# Patient Record
Sex: Female | Born: 1945 | Hispanic: No | Smoking: Never smoker
Health system: Southern US, Community
[De-identification: ages and names within clinical notes are randomized; demographics above are authoritative.]

## PROBLEM LIST (undated history)

## (undated) DIAGNOSIS — H409 Unspecified glaucoma: Secondary | ICD-10-CM

## (undated) DIAGNOSIS — M810 Age-related osteoporosis without current pathological fracture: Secondary | ICD-10-CM

## (undated) DIAGNOSIS — K219 Gastro-esophageal reflux disease without esophagitis: Secondary | ICD-10-CM

## (undated) DIAGNOSIS — E559 Vitamin D deficiency, unspecified: Secondary | ICD-10-CM

## (undated) DIAGNOSIS — I10 Essential (primary) hypertension: Secondary | ICD-10-CM

## (undated) HISTORY — DX: Essential (primary) hypertension: I10

## (undated) HISTORY — DX: Gastro-esophageal reflux disease without esophagitis: K21.9

## (undated) HISTORY — DX: Vitamin D deficiency, unspecified: E55.9

## (undated) HISTORY — DX: Unspecified glaucoma: H40.9

## (undated) HISTORY — PX: CATARACT EXTRACTION W/ INTRAOCULAR LENS IMPLANT: SHX1309

## (undated) HISTORY — DX: Age-related osteoporosis without current pathological fracture: M81.0

## (undated) HISTORY — PX: ABDOMINAL HYSTERECTOMY: SHX81

---

## 2010-06-07 ENCOUNTER — Ambulatory Visit: Payer: Self-pay | Admitting: Internal Medicine

## 2010-06-15 ENCOUNTER — Ambulatory Visit: Payer: Self-pay | Admitting: Internal Medicine

## 2010-10-04 ENCOUNTER — Ambulatory Visit: Payer: Self-pay | Admitting: Family

## 2010-10-06 ENCOUNTER — Ambulatory Visit: Payer: Self-pay | Admitting: Internal Medicine

## 2010-10-15 ENCOUNTER — Ambulatory Visit: Payer: Self-pay | Admitting: Internal Medicine

## 2010-10-29 ENCOUNTER — Emergency Department: Payer: Self-pay | Admitting: Emergency Medicine

## 2011-02-02 ENCOUNTER — Ambulatory Visit: Payer: Self-pay | Admitting: Internal Medicine

## 2011-02-14 ENCOUNTER — Ambulatory Visit: Payer: Self-pay | Admitting: Internal Medicine

## 2011-06-20 ENCOUNTER — Ambulatory Visit: Payer: Self-pay | Admitting: Ophthalmology

## 2011-08-03 ENCOUNTER — Ambulatory Visit: Payer: Self-pay | Admitting: Internal Medicine

## 2011-08-03 LAB — CBC CANCER CENTER
Basophil: 2 %
HCT: 32.6 % — ABNORMAL LOW (ref 35.0–47.0)
MCH: 22 pg — ABNORMAL LOW (ref 26.0–34.0)
MCV: 67 fL — ABNORMAL LOW (ref 80–100)
Monocytes: 4 %
Platelet: 222 x10 3/mm (ref 150–440)
RBC: 4.85 10*6/uL (ref 3.80–5.20)
RDW: 16.1 % — ABNORMAL HIGH (ref 11.5–14.5)

## 2011-08-03 LAB — RETICULOCYTES
Absolute Retic Count: 0.131 10*6/uL — ABNORMAL HIGH (ref 0.024–0.084)
Reticulocyte: 2.7 % — ABNORMAL HIGH (ref 0.5–1.5)

## 2011-08-17 ENCOUNTER — Ambulatory Visit: Payer: Self-pay | Admitting: Internal Medicine

## 2011-10-31 ENCOUNTER — Ambulatory Visit: Payer: Self-pay | Admitting: Ophthalmology

## 2014-02-18 ENCOUNTER — Ambulatory Visit: Payer: Self-pay | Admitting: Family Medicine

## 2014-09-30 ENCOUNTER — Ambulatory Visit: Payer: Self-pay | Admitting: Cardiovascular Disease

## 2014-10-07 ENCOUNTER — Encounter: Payer: Self-pay | Admitting: Cardiovascular Disease

## 2014-10-07 ENCOUNTER — Ambulatory Visit (INDEPENDENT_AMBULATORY_CARE_PROVIDER_SITE_OTHER): Payer: Medicaid Other | Admitting: Cardiovascular Disease

## 2014-10-07 ENCOUNTER — Encounter (INDEPENDENT_AMBULATORY_CARE_PROVIDER_SITE_OTHER): Payer: Self-pay

## 2014-10-07 VITALS — BP 130/82 | HR 60 | Ht 62.0 in | Wt 124.8 lb

## 2014-10-07 DIAGNOSIS — I159 Secondary hypertension, unspecified: Secondary | ICD-10-CM | POA: Diagnosis not present

## 2014-10-07 DIAGNOSIS — E785 Hyperlipidemia, unspecified: Secondary | ICD-10-CM

## 2014-10-07 DIAGNOSIS — R079 Chest pain, unspecified: Secondary | ICD-10-CM

## 2014-10-07 DIAGNOSIS — Z01810 Encounter for preprocedural cardiovascular examination: Secondary | ICD-10-CM

## 2014-10-07 DIAGNOSIS — I10 Essential (primary) hypertension: Secondary | ICD-10-CM

## 2014-10-07 DIAGNOSIS — H5462 Unqualified visual loss, left eye, normal vision right eye: Secondary | ICD-10-CM

## 2014-10-07 NOTE — Assessment & Plan Note (Signed)
Patient's son reports that the patient has vision loss on the left. Surgery planned for the right eye. He indicates history of glaucoma

## 2014-10-07 NOTE — Progress Notes (Signed)
Patient ID: Rachel [name], female    DOB: 12/07/1945, 69 y.o.   MRN: [id number]  HPI Comments: Rachel [name] is a very pleasant 69 year old woman from [place], who does not speaking issue presents with her son for preoperative evaluation for eye surgery. She is due to have eye surgery at [facility].  Son reports she has no prior cardiac history. Blood pressure has been relatively well controlled. She was started on low-dose HCTZ 12.5 mg daily in addition to atenolol 25 mill grams daily in January 2016 but reports having side effects to the HCTZ. She has been continued on the atenolol with good blood pressure. Overall she feels well. 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  Son reports she is active, does all of her ADLs, goes shopping, does gardening, of her housework. Denies any shortness of breath, chest pain, leg edema, orthopnea or PND  Recent blood work shows total cholesterol 212, LDL 115, HDL 77, normal renal function, potassium 4.3, low vitamin D of 17   EKG on today's visit shows normal sinus rhythm with no significant ST or T-wave changes  Notes indicate allergy to ace inhibitors   No Known Allergies  Outpatient Encounter Prescriptions as of 10/07/2014  Medication Sig  . alendronate (FOSAMAX) 70 MG tablet Take 70 mg by mouth once a week. Take with a full glass of water on an empty stomach.  Marland Kitchen. atenolol (TENORMIN) 25 MG tablet Take 25 mg by mouth daily. for high blood pressure  . brimonidine (ALPHAGAN) 0.2 % ophthalmic solution Place 1 drop into both eyes 2 (two) times daily.   . Calcium Carbonate (CALCIUM 600 PO) Take 600 mg by mouth daily.  . cholecalciferol (VITAMIN D) 1000 UNITS tablet Take 1,000 Units by mouth daily.  . dorzolamide-timolol (COSOPT) 22.3-6.8 MG/ML ophthalmic solution Place 1 drop into both eyes 2 (two) times daily.   Marland Kitchen. latanoprost (XALATAN) 0.005 % ophthalmic solution Place 1 drop  into both eyes at bedtime.   . pantoprazole (PROTONIX) 40 MG tablet Take 40 mg by mouth daily.   . [DISCONTINUED] hydrochlorothiazide (MICROZIDE) 12.5 MG capsule Take 12.5 mg by mouth daily.    Past Medical History  Diagnosis Date  . Hypertension   . Vitamin D deficiency   . GERD (gastroesophageal reflux disease)   . Osteoporosis   . Glaucoma     History reviewed. No pertinent past surgical history.  Social History  reports that she has never smoked. She does not have any smokeless tobacco history on file. She reports that she drinks about 0.6 oz of alcohol per week. She reports that she does not use illicit drugs.  Family History Family history is unknown by patient.      Review of Systems  Constitutional: Negative.   Eyes: Positive for visual disturbance.  Respiratory: Negative.   Cardiovascular: Negative.   Gastrointestinal: Negative.   Musculoskeletal: Negative.   Skin: Negative.   Neurological: Negative.   Hematological: Negative.   Psychiatric/Behavioral: Negative.   All other systems reviewed and are negative.   BP 130/82 mmHg  Pulse 60  Ht 5\' 2"  (1.575 m)  Wt 124 lb 12 oz (56.586 kg)  BMI 22.81 kg/m2  Physical Exam  Constitutional: She is oriented to person, place, and time. She appears well-developed and well-nourished.  HENT:  Head: Normocephalic.  Nose: Nose normal.  Mouth/Throat: Oropharynx is clear and moist.  Eyes: Conjunctivae are normal. Pupils  are equal, round, and reactive to light.  Neck: Normal range of motion. Neck supple. No JVD present.  Cardiovascular: Normal rate, regular rhythm, S1 normal, S2 normal, normal heart sounds and intact distal pulses.  Exam reveals no gallop and no friction rub.   No murmur heard. Pulmonary/Chest: Effort normal and breath sounds normal. No respiratory distress. She has no wheezes. She has no rales. She exhibits no tenderness.  Abdominal: Soft. Bowel sounds are normal. She exhibits no distension. There is no  tenderness.  Musculoskeletal: Normal range of motion. She exhibits no edema or tenderness.  Lymphadenopathy:    She has no cervical adenopathy.  Neurological: She is alert and oriented to person, place, and time. Coordination normal.  Skin: Skin is warm and dry. No rash noted. No erythema.  Psychiatric: She has a normal mood and affect. Her behavior is normal. Judgment and thought content normal.    Assessment and Plan  Nursing note and vitals reviewed.

## 2014-10-07 NOTE — Patient Instructions (Signed)
You are doing well. No medication changes were made.  You are acceptable risk for eye surgery  Please call us if you have new issues that need to be addressed before your next appt.

## 2014-10-07 NOTE — Assessment & Plan Note (Signed)
Should be acceptable risk for upcoming eye surgery. No further testing needed. No prior cardiac history. Blood pressure well controlled, normal EKG No symptoms concerning for angina

## 2014-10-07 NOTE — Assessment & Plan Note (Signed)
Total cholesterol 212. Numbers were discussed with the patient and her son. Recommended regular exercise, strict diet. No significant risk factors warranting a statin

## 2014-10-07 NOTE — Assessment & Plan Note (Signed)
Blood pressure is well controlled on today's visit. No changes made to the medications. Patient reports she did not tolerate HCTZ eared she will stay on atenolol alone

## 2014-11-01 ENCOUNTER — Ambulatory Visit
Admit: 2014-11-01 | Disposition: A | Discharge status: Home / Self Care | Payer: Self-pay | Attending: Ophthalmology | Admitting: Ophthalmology

## 2015-08-15 ENCOUNTER — Encounter: Payer: Self-pay | Admitting: Emergency Medicine

## 2015-08-15 ENCOUNTER — Emergency Department
Admission: EM | Admit: 2015-08-15 | Discharge: 2015-08-15 | Disposition: A | Discharge status: Home / Self Care | Payer: Medicaid Other | Attending: Emergency Medicine | Admitting: Emergency Medicine

## 2015-08-15 ENCOUNTER — Emergency Department: Payer: Medicaid Other

## 2015-08-15 DIAGNOSIS — M545 Low back pain: Secondary | ICD-10-CM | POA: Insufficient documentation

## 2015-08-15 DIAGNOSIS — I10 Essential (primary) hypertension: Secondary | ICD-10-CM | POA: Diagnosis not present

## 2015-08-15 DIAGNOSIS — Z79899 Other long term (current) drug therapy: Secondary | ICD-10-CM | POA: Diagnosis not present

## 2015-08-15 DIAGNOSIS — R1031 Right lower quadrant pain: Secondary | ICD-10-CM | POA: Diagnosis not present

## 2015-08-15 DIAGNOSIS — R109 Unspecified abdominal pain: Secondary | ICD-10-CM | POA: Diagnosis present

## 2015-08-15 LAB — COMPREHENSIVE METABOLIC PANEL
ALK PHOS: 55 U/L (ref 38–126)
ALT: 11 U/L — ABNORMAL LOW (ref 14–54)
AST: 17 U/L (ref 15–41)
Albumin: 4.5 g/dL (ref 3.5–5.0)
Anion gap: 5 (ref 5–15)
BUN: 21 mg/dL — AB (ref 6–20)
CHLORIDE: 114 mmol/L — AB (ref 101–111)
CO2: 19 mmol/L — ABNORMAL LOW (ref 22–32)
Calcium: 8.9 mg/dL (ref 8.9–10.3)
Creatinine, Ser: 0.85 mg/dL (ref 0.44–1.00)
GFR calc Af Amer: >60 mL/min (ref >60)
Glucose, Bld: 99 mg/dL (ref 65–99)
Potassium: 3.1 mmol/L — ABNORMAL LOW (ref 3.5–5.1)
Sodium: 138 mmol/L (ref 135–145)
TOTAL PROTEIN: 8 g/dL (ref 6.5–8.1)
Total Bilirubin: 0.3 mg/dL (ref 0.3–1.2)

## 2015-08-15 LAB — URINALYSIS COMPLETE WITH MICROSCOPIC (ARMC ONLY)
BACTERIA UA: NONE SEEN
BILIRUBIN URINE: NEGATIVE
GLUCOSE, UA: NEGATIVE mg/dL
Hgb urine dipstick: NEGATIVE
KETONES UR: NEGATIVE mg/dL
LEUKOCYTES UA: NEGATIVE
Nitrite: NEGATIVE
PH: 6 (ref 5.0–8.0)
Protein, ur: NEGATIVE mg/dL
Specific Gravity, Urine: 1.011 (ref 1.005–1.030)

## 2015-08-15 LAB — CBC
HEMATOCRIT: 34 % — AB (ref 35.0–47.0)
Hemoglobin: 11 g/dL — ABNORMAL LOW (ref 12.0–16.0)
MCH: 21 pg — AB (ref 26.0–34.0)
MCHC: 32.4 g/dL (ref 32.0–36.0)
MCV: 64.7 fL — AB (ref 80.0–100.0)
PLATELETS: 174 10*3/uL (ref 150–440)
RBC: 5.25 MIL/uL — AB (ref 3.80–5.20)
RDW: 15.7 % — ABNORMAL HIGH (ref 11.5–14.5)
WBC: 9.4 10*3/uL (ref 3.6–11.0)

## 2015-08-15 LAB — LIPASE, BLOOD: Lipase: 40 U/L (ref 11–51)

## 2015-08-15 MED ORDER — IOHEXOL 300 MG/ML  SOLN
75.0000 mL | Freq: Once | INTRAMUSCULAR | Status: AC | PRN
  Administered 2015-08-15: 75 mL via INTRAVENOUS
  Filled 2015-08-15: qty 75

## 2015-08-15 MED ORDER — IOHEXOL 240 MG/ML SOLN
25.0000 mL | Freq: Once | INTRAMUSCULAR | Status: AC | PRN
  Administered 2015-08-15: 25 mL via ORAL
  Filled 2015-08-15: qty 25

## 2015-08-15 MED ORDER — ONDANSETRON HCL 4 MG/2ML IJ SOLN
4.0000 mg | Freq: Once | INTRAMUSCULAR | Status: AC
  Administered 2015-08-15: 4 mg via INTRAVENOUS
  Filled 2015-08-15: qty 2

## 2015-08-15 MED ORDER — HYDROCODONE-ACETAMINOPHEN 5-325 MG PO TABS: 1.0000 | ORAL_TABLET | ORAL | Status: DC | PRN

## 2015-08-15 MED ORDER — MORPHINE SULFATE (PF) 4 MG/ML IV SOLN
4.0000 mg | Freq: Once | INTRAVENOUS | Status: AC
Start: 2015-08-15 — End: 2015-08-15
  Administered 2015-08-15: 4 mg via INTRAVENOUS
  Filled 2015-08-15: qty 1

## 2015-08-15 NOTE — ED Provider Notes (Signed)
[facility] Emergency Department Provider Note  ____________________________________________    I have reviewed the triage vital signs and the nursing notes.   HISTORY  Chief Complaint Abdominal Pain  [place] interpreter used  HPI Rachel [name] is a 70 y.o. female who presents with complaints of right back/flank pain. This became worse this morning. She describes the pain as moderate to severe and worse with ambulating. She is never had this before. She denies fevers. She denies dysuria. No nausea or vomiting. No lower extremity weakness or sensory changes. No urinary incontinence.     Past Medical History  Diagnosis Date  . Hypertension   . Vitamin D deficiency   . GERD (gastroesophageal reflux disease)   . Osteoporosis   . Glaucoma     Patient Active Problem List   Diagnosis Date Noted  . Preop cardiovascular exam 10/07/2014  . Essential hypertension 10/07/2014  . Hyperlipidemia 10/07/2014  . Vision loss of left eye 10/07/2014    History reviewed. No pertinent past surgical history.  Current Outpatient Rx  Name  Route  Sig  Dispense  Refill  . alendronate (FOSAMAX) 70 MG tablet   Oral   Take 70 mg by mouth once a week. Take with a full glass of water on an empty stomach.         Marland Kitchen atenolol (TENORMIN) 25 MG tablet   Oral   Take 25 mg by mouth daily. for high blood pressure      0   . brimonidine (ALPHAGAN) 0.2 % ophthalmic solution   Both Eyes   Place 1 drop into both eyes 2 (two) times daily.       0   . Calcium Carbonate (CALCIUM 600 PO)   Oral   Take 600 mg by mouth daily.         . cholecalciferol (VITAMIN D) 1000 UNITS tablet   Oral   Take 1,000 Units by mouth daily.         . dorzolamide-timolol (COSOPT) 22.3-6.8 MG/ML ophthalmic solution   Both Eyes   Place 1 drop into both eyes 2 (two) times daily.       0   . latanoprost (XALATAN) 0.005 % ophthalmic solution   Both Eyes   Place 1 drop into both eyes at  bedtime.       0   . pantoprazole (PROTONIX) 40 MG tablet   Oral   Take 40 mg by mouth daily.       0     Allergies Review of patient's allergies indicates no known allergies.  Family History  Problem Relation Age of Onset  . Family history unknown: Yes    Social History Social History  Substance Use Topics  . Smoking status: Never Smoker   . Smokeless tobacco: None  . Alcohol Use: 0.6 oz/week    1 Glasses of wine per week    Review of Systems  Constitutional: Negative for fever. Eyes: Negative for change in vision ENT: Negative for sore throat Cardiovascular: Negative for chest pain. Respiratory: Negative for shortness of breath. Gastrointestinal: Right flank pain Genitourinary: Negative for dysuria. Negative frequency Musculoskeletal: Right-sided back pain Skin: Negative for rash. Neurological: Negative for headaches or focal weakness     ____________________________________________   PHYSICAL EXAM:  VITAL SIGNS: ED Triage Vitals  Enc Vitals Group     BP 08/15/15 1801 151/86 mmHg     Pulse Rate 08/15/15 1801 65     Resp 08/15/15 1801 18  Temp 08/15/15 1801 99.2 F (37.3 C)     Temp Source 08/15/15 1801 Oral     SpO2 08/15/15 1801 98 %     Weight 08/15/15 1801 120 lb (54.432 kg)     Height 08/15/15 1801  (1.575 m)     Head Cir --      Peak Flow --      Pain Score 08/15/15 1802 8     Pain Loc --      Pain Edu? --      Excl. in GC? --      Constitutional: Alert and oriented. Well appearing and in no distress. Eyes: Conjunctivae are normal.  ENT   Head: Normocephalic and atraumatic.   Mouth/Throat: Mucous membranes are moist. Cardiovascular: Normal rate, regular rhythm. Normal and symmetric distal pulses are present in all extremities.  Respiratory: Normal respiratory effort without tachypnea nor retractions.  Gastrointestinal: Mild right lower quadrant tenderness to palpation. No distention.  Genitourinary:  deferred Musculoskeletal: Nontender with normal range of motion in all extremities. No lower extremity tenderness nor edema. Mild right lumbar paraspinal tenderness to palpation, no vertebral tenderness to palpation. Normal strength in the lower extremities Neurologic:  Normal speech and language. No gross focal neurologic deficits are appreciated. Skin:  Skin is warm, dry and intact. No rash noted. Psychiatric: Mood and affect are normal. Patient exhibits appropriate insight and judgment.  ____________________________________________    LABS (pertinent positives/negatives)  Labs Reviewed  COMPREHENSIVE METABOLIC PANEL - Abnormal; Notable for the following:    Potassium 3.1 (*)    Chloride 114 (*)    CO2 19 (*)    BUN 21 (*)    ALT 11 (*)    All other components within normal limits  CBC - Abnormal; Notable for the following:    RBC 5.25 (*)    Hemoglobin 11.0 (*)    HCT 34.0 (*)    MCV 64.7 (*)    MCH 21.0 (*)    RDW 15.7 (*)    All other components within normal limits  URINALYSIS COMPLETEWITH MICROSCOPIC ([facility] ONLY) - Abnormal; Notable for the following:    Color, Urine YELLOW (*)    APPearance CLEAR (*)    Squamous Epithelial / LPF 0-5 (*)    All other components within normal limits  LIPASE, BLOOD    ____________________________________________   EKG  None  ____________________________________________    RADIOLOGY I have personally reviewed any xrays that were ordered on this patient: CT scan shows no acute abdominal abnormalities, 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  Patient presents with right-sided flank/back pain. She does have mild right lower quadrant abdominal tenderness to palpation. Her labs are relatively unremarkable she is well-appearing with normal vitals. Her urinalysis demonstrates no infection. We will obtain CT abdomen pelvis. And give morphine and Zofran IV  CT shows no acute distress. He does demonstrate L3-L4 facet arthritis which may be the cause of her right-sided back pain.  Patient is feeling better after pain medication. CT abdomen pelvis and lab work is overall reassuring. I have asked her to follow up closely with her PCP. Return precautions discussed. I will write for  analgesics  ____________________________________________   FINAL CLINICAL IMPRESSION(S) / ED DIAGNOSES  Final diagnoses:  Right lower quadrant abdominal pain     Jene Every, MD 08/15/15 2211

## 2015-08-15 NOTE — ED Notes (Signed)
R flank and abdominal pain since this am. Denies fevers.

## 2015-08-15 NOTE — Discharge Instructions (Signed)

## 2015-08-15 NOTE — ED Notes (Signed)
CT called, pt finished contrast.

## 2015-08-15 NOTE — ED Notes (Signed)
MD Kinner at bedside  

## 2015-08-17 ENCOUNTER — Encounter: Payer: Self-pay | Admitting: Urgent Care

## 2015-08-17 ENCOUNTER — Emergency Department
Admission: EM | Admit: 2015-08-17 | Discharge: 2015-08-18 | Disposition: A | Discharge status: Home / Self Care | Payer: Medicaid Other | Attending: Emergency Medicine | Admitting: Emergency Medicine

## 2015-08-17 DIAGNOSIS — I10 Essential (primary) hypertension: Secondary | ICD-10-CM | POA: Insufficient documentation

## 2015-08-17 DIAGNOSIS — R109 Unspecified abdominal pain: Secondary | ICD-10-CM | POA: Insufficient documentation

## 2015-08-17 DIAGNOSIS — R42 Dizziness and giddiness: Secondary | ICD-10-CM | POA: Diagnosis not present

## 2015-08-17 DIAGNOSIS — Z79899 Other long term (current) drug therapy: Secondary | ICD-10-CM | POA: Diagnosis not present

## 2015-08-17 DIAGNOSIS — E876 Hypokalemia: Secondary | ICD-10-CM | POA: Insufficient documentation

## 2015-08-17 DIAGNOSIS — G8929 Other chronic pain: Secondary | ICD-10-CM | POA: Diagnosis not present

## 2015-08-17 DIAGNOSIS — M545 Low back pain: Secondary | ICD-10-CM | POA: Diagnosis not present

## 2015-08-17 DIAGNOSIS — R112 Nausea with vomiting, unspecified: Secondary | ICD-10-CM | POA: Insufficient documentation

## 2015-08-17 LAB — CBC
HCT: 31.6 % — ABNORMAL LOW (ref 35.0–47.0)
Hemoglobin: 10.7 g/dL — ABNORMAL LOW (ref 12.0–16.0)
MCH: 21.3 pg — AB (ref 26.0–34.0)
MCHC: 33.7 g/dL (ref 32.0–36.0)
MCV: 63.1 fL — ABNORMAL LOW (ref 80.0–100.0)
PLATELETS: 174 10*3/uL (ref 150–440)
RBC: 5.01 MIL/uL (ref 3.80–5.20)
RDW: 15.2 % — AB (ref 11.5–14.5)
WBC: 10.5 10*3/uL (ref 3.6–11.0)

## 2015-08-17 LAB — TROPONIN I: Troponin I: <0.03 ng/mL (ref <0.031)

## 2015-08-17 LAB — COMPREHENSIVE METABOLIC PANEL
ALK PHOS: 49 U/L (ref 38–126)
ALT: 11 U/L — AB (ref 14–54)
AST: 18 U/L (ref 15–41)
Albumin: 4.1 g/dL (ref 3.5–5.0)
Anion gap: 7 (ref 5–15)
BUN: 16 mg/dL (ref 6–20)
CALCIUM: 8.9 mg/dL (ref 8.9–10.3)
CO2: 21 mmol/L — AB (ref 22–32)
CREATININE: 0.74 mg/dL (ref 0.44–1.00)
Chloride: 110 mmol/L (ref 101–111)
Glucose, Bld: 115 mg/dL — ABNORMAL HIGH (ref 65–99)
Potassium: 3.2 mmol/L — ABNORMAL LOW (ref 3.5–5.1)
SODIUM: 138 mmol/L (ref 135–145)
Total Bilirubin: 1 mg/dL (ref 0.3–1.2)
Total Protein: 8 g/dL (ref 6.5–8.1)

## 2015-08-17 LAB — URINALYSIS COMPLETE WITH MICROSCOPIC (ARMC ONLY)
BILIRUBIN URINE: NEGATIVE
GLUCOSE, UA: NEGATIVE mg/dL
Hgb urine dipstick: NEGATIVE
KETONES UR: NEGATIVE mg/dL
Nitrite: NEGATIVE
PROTEIN: NEGATIVE mg/dL
RBC / HPF: NONE SEEN RBC/hpf (ref 0–5)
SPECIFIC GRAVITY, URINE: 1.006 (ref 1.005–1.030)
pH: 7 (ref 5.0–8.0)

## 2015-08-17 LAB — LIPASE, BLOOD: Lipase: 29 U/L (ref 11–51)

## 2015-08-17 MED ORDER — PROMETHAZINE HCL 25 MG/ML IJ SOLN
25.0000 mg | Freq: Once | INTRAMUSCULAR | Status: AC
  Administered 2015-08-17: 25 mg via INTRAVENOUS
  Filled 2015-08-17: qty 1

## 2015-08-17 MED ORDER — ONDANSETRON 4 MG PO TBDP: 4.0000 mg | ORAL_TABLET | Freq: Three times a day (TID) | ORAL | Status: DC | PRN

## 2015-08-17 MED ORDER — POTASSIUM CHLORIDE CRYS ER 20 MEQ PO TBCR
40.0000 meq | EXTENDED_RELEASE_TABLET | Freq: Once | ORAL | Status: AC
  Administered 2015-08-17: 40 meq via ORAL
  Filled 2015-08-17: qty 2

## 2015-08-17 MED ORDER — SODIUM CHLORIDE 0.9 % IV BOLUS (SEPSIS)
1000.0000 mL | Freq: Once | INTRAVENOUS | Status: AC
  Administered 2015-08-17: 1000 mL via INTRAVENOUS

## 2015-08-17 MED ORDER — ONDANSETRON HCL 4 MG/2ML IJ SOLN
4.0000 mg | Freq: Once | INTRAMUSCULAR | Status: AC
  Administered 2015-08-17: 4 mg via INTRAVENOUS
  Filled 2015-08-17: qty 2

## 2015-08-17 MED ORDER — PROMETHAZINE HCL 25 MG PO TABS
25.0000 mg | ORAL_TABLET | Freq: Three times a day (TID) | ORAL | Status: DC | PRN
Start: 2015-08-17 — End: 2017-07-10

## 2015-08-17 MED ORDER — PROMETHAZINE HCL 25 MG RE SUPP: 25.0000 mg | Freq: Four times a day (QID) | RECTAL | Status: DC | PRN

## 2015-08-17 NOTE — Discharge Instructions (Signed)
Please take a clear liquid diet for the next 24-48 hours. Then advance to [facility] as described. You may take Zofran for nausea and vomiting.  Return to the emergency department if you are unable to keep down fluids, develops severe pain, fever, lightheadedness or fainting, or any other symptoms concerning to you.

## 2015-08-17 NOTE — ED Notes (Signed)
Patient presents with c/o vomiting since Tuesday. Of note, patient was seen here for abd pain and back pain on 1/30 and sent home with an inconclusive Dx. Patient Rx'd Vicodin. Son with patient in triage and is requesting "Morphine and an IV to make her feel better".

## 2015-08-17 NOTE — ED Provider Notes (Signed)
[facility] Emergency Department Provider Note  ____________________________________________  Time seen: Approximately 9:09 PM  I have reviewed the triage vital signs and the nursing notes.   HISTORY  Chief Complaint Emesis    HPI Rachel [name] is a 70 y.o. female with a history of HTN presenting for nausea and vomiting. Patient was seen here 1/30 4 right flank and right back pain with reassuring laboratory studies, vital signs, EKG and a CT scan that did not show any acute intra-abdominal process. She was discharged home with Vicodin for her back pain. After her first Vicodin, the patient started to have vomiting. She took a second one and vomited again. However, her last Vicodin was yesterday morning in her vomiting has been persistent over the last 24 hours. She is not had any diarrhea and states that her right-sided flank and abdominal pain has significantly improved. She denies any headache but has some mild lightheadedness. No visual changes, chest pain or shortness of breath. No known sick contacts. No urinary symptoms.    Past Medical History  Diagnosis Date  . Hypertension   . Vitamin D deficiency   . GERD (gastroesophageal reflux disease)   . Osteoporosis   . Glaucoma     Patient Active Problem List   Diagnosis Date Noted  . Preop cardiovascular exam 10/07/2014  . Essential hypertension 10/07/2014  . Hyperlipidemia 10/07/2014  . Vision loss of left eye 10/07/2014    History reviewed. No pertinent past surgical history.  Current Outpatient Rx  Name  Route  Sig  Dispense  Refill  . alendronate (FOSAMAX) 70 MG tablet   Oral   Take 70 mg by mouth once a week. Take with a full glass of water on an empty stomach.         Marland Kitchen atenolol (TENORMIN) 25 MG tablet   Oral   Take 25 mg by mouth daily. for high blood pressure      0   . brimonidine (ALPHAGAN) 0.2 % ophthalmic solution   Both Eyes   Place 1 drop into both eyes 2 (two) times daily.        0   . Calcium Carbonate (CALCIUM 600 PO)   Oral   Take 600 mg by mouth daily.         . cholecalciferol (VITAMIN D) 1000 UNITS tablet   Oral   Take 1,000 Units by mouth daily.         . dorzolamide-timolol (COSOPT) 22.3-6.8 MG/ML ophthalmic solution   Both Eyes   Place 1 drop into both eyes 2 (two) times daily.       0   . latanoprost (XALATAN) 0.005 % ophthalmic solution   Both Eyes   Place 1 drop into both eyes at bedtime.       0   . ondansetron (ZOFRAN ODT) 4 MG disintegrating tablet   Oral   Take 1 tablet (4 mg total) by mouth every 8 (eight) hours as needed for nausea or vomiting.   20 tablet   0   . pantoprazole (PROTONIX) 40 MG tablet   Oral   Take 40 mg by mouth daily.       0   . promethazine (PHENERGAN) 25 MG suppository   Rectal   Place 1 suppository (25 mg total) rectally every 6 (six) hours as needed for nausea.   12 suppository   0   . promethazine (PHENERGAN) 25 MG tablet   Oral   Take 1 tablet (25 mg  total) by mouth every 8 (eight) hours as needed for nausea or vomiting.   12 tablet   0     Allergies Review of patient's allergies indicates no known allergies.  Family History  Problem Relation Age of Onset  . Family history unknown: Yes    Social History Social History  Substance Use Topics  . Smoking status: Never Smoker   . Smokeless tobacco: None  . Alcohol Use: 0.6 oz/week    1 Glasses of wine per week    Review of Systems Constitutional: No fever/chills. Has of lightheadedness. Negative syncope. Eyes: No visual changes. ENT: No sore throat. Cardiovascular: Denies chest pain, palpitations. Respiratory: Denies shortness of breath.  No cough. Gastrointestinal: No abdominal pain.  Positive nausea, positive vomiting.  No diarrhea.  No constipation. Genitourinary: Negative for dysuria. Musculoskeletal: Positive for chronic back pain. Skin: Negative for rash. Neurological: Negative for headaches, focal weakness or  numbness.  10-point ROS otherwise negative.  ____________________________________________   PHYSICAL EXAM:  VITAL SIGNS: ED Triage Vitals  Enc Vitals Group     BP 08/17/15 2022 145/76 mmHg     Pulse Rate 08/17/15 2022 87     Resp 08/17/15 2022 16     Temp 08/17/15 2022 98.6 F (37 C)     Temp Source 08/17/15 2022 Oral     SpO2 08/17/15 2022 96 %     Weight 08/17/15 2022 120 lb (54.432 kg)     Height --      Head Cir --      Peak Flow --      Pain Score 08/17/15 2019 6     Pain Loc --      Pain Edu? --      Excl. in GC? --     Constitutional: Alert and oriented. Well appearing and in no acute distress. Answer question appropriately. Eyes: Conjunctivae are normal.  EOMI. no scleral icterus. Head: Atraumatic. Nose: No congestion/rhinnorhea. Mouth/Throat: Mucous membranes are moist.  Neck: No stridor.  Supple.  No JVD. Cardiovascular: Normal rate, regular rhythm. No murmurs, rubs or gallops.  Respiratory: Normal respiratory effort.  No retractions. Lungs CTAB.  No wheezes, rales or ronchi. Gastrointestinal: Soft and nontender. No distention. No peritoneal signs. No guarding or rebound. Musculoskeletal: No LE edema.  Neurologic:  Normal speech and language. No gross focal neurologic deficits are appreciated.  Skin:  Skin is warm, dry and intact. No rash noted. Psychiatric: Mood and affect are normal. Speech and behavior are normal.  Normal judgement.  ____________________________________________   LABS (all labs ordered are listed, but only abnormal results are displayed)  Labs Reviewed  COMPREHENSIVE METABOLIC PANEL - Abnormal; Notable for the following:    Potassium 3.2 (*)    CO2 21 (*)    Glucose, Bld 115 (*)    ALT 11 (*)    All other components within normal limits  CBC - Abnormal; Notable for the following:    Hemoglobin 10.7 (*)    HCT 31.6 (*)    MCV 63.1 (*)    MCH 21.3 (*)    RDW 15.2 (*)    All other components within normal limits  LIPASE, BLOOD   TROPONIN I  URINALYSIS COMPLETEWITH MICROSCOPIC ([facility] ONLY)   ____________________________________________  EKG  ED ECG REPORT I, Rockne Menghini, the attending physician, personally viewed and interpreted this ECG.   Date: 08/17/2015  EKG Time: 2301   Rate: 81  Rhythm: normal sinus rhythm  Axis: normal  Intervals:none  ST&T Change:  no st elevation  ____________________________________________  RADIOLOGY  No results found.  ____________________________________________   PROCEDURES  Procedure(s) performed: None  Critical Care performed: No ____________________________________________   INITIAL IMPRESSION / ASSESSMENT AND PLAN / ED COURSE  Pertinent labs & imaging results that were available during my care of the patient were reviewed by me and considered in my medical decision making (see chart for details).  70 y.o. female with recent ED visit for right flank pain now presenting with nausea and vomiting. The patient reports that her pain has significantly improved and she does not have any reproducible pain on my exam. She is afebrile. Her symptoms started after taking Vicodin, so side effect to medication is possible, however I would have anticipated that her symptoms would have improved by today. It is possible that she has a viral GI illness, although she has not had any diarrhea yet. There is no focal findings on her abdominal exam that would suggest an acute abdominal pathology, and she has a CAT scan from 3 days ago which is completely normal. She does not have headache or any neurologic findings, so intracranial pathology causing vomiting is also unlikely. I will await her laboratory studies, and treat her symptomatically.  ----------------------------------------- 10:42 PM on 08/17/2015 -----------------------------------------  The patient was feeling better, but then she drinks some water and vomited. I will re-dose her with an antiemetic and  reevaluate her. ____________________________________________  FINAL CLINICAL IMPRESSION(S) / ED DIAGNOSES  Final diagnoses:  Non-intractable vomiting with nausea, vomiting of unspecified type  Hypokalemia      NEW MEDICATIONS STARTED DURING THIS VISIT:  New Prescriptions   ONDANSETRON (ZOFRAN ODT) 4 MG DISINTEGRATING TABLET    Take 1 tablet (4 mg total) by mouth every 8 (eight) hours as needed for nausea or vomiting.   PROMETHAZINE (PHENERGAN) 25 MG SUPPOSITORY    Place 1 suppository (25 mg total) rectally every 6 (six) hours as needed for nausea.   PROMETHAZINE (PHENERGAN) 25 MG TABLET    Take 1 tablet (25 mg total) by mouth every 8 (eight) hours as needed for nausea or vomiting.     Rockne Menghini, MD 08/17/15 2303

## 2015-08-18 NOTE — ED Notes (Signed)
Pt ambulated in hall from [facility] 8 to [facility] 1 and back with this RN. Pt reports some minimal SHOB; noted to have slow but steady gait with no difficulty or distress observed.

## 2015-09-12 ENCOUNTER — Ambulatory Visit
Admission: RE | Admit: 2015-09-12 | Discharge: 2015-09-12 | Disposition: A | Discharge status: Home / Self Care | Payer: Medicaid Other | Source: Intra-hospital | Attending: Primary Care | Admitting: Primary Care

## 2015-09-12 ENCOUNTER — Ambulatory Visit
Admission: RE | Admit: 2015-09-12 | Discharge: 2015-09-12 | Disposition: A | Discharge status: Home / Self Care | Payer: Medicaid Other | Source: Ambulatory Visit | Attending: Primary Care | Admitting: Primary Care

## 2015-09-12 ENCOUNTER — Other Ambulatory Visit: Payer: Self-pay | Admitting: Primary Care

## 2015-09-12 DIAGNOSIS — R05 Cough: Secondary | ICD-10-CM

## 2015-09-12 DIAGNOSIS — R059 Cough, unspecified: Secondary | ICD-10-CM

## 2015-10-17 ENCOUNTER — Ambulatory Visit: Payer: Medicaid Other

## 2015-10-17 ENCOUNTER — Inpatient Hospital Stay: Payer: Medicaid Other | Attending: Internal Medicine | Admitting: Internal Medicine

## 2015-10-17 VITALS — BP 141/76 | HR 54 | Temp 98.2°F | Resp 18 | Wt 112.7 lb

## 2015-10-17 DIAGNOSIS — M818 Other osteoporosis without current pathological fracture: Secondary | ICD-10-CM | POA: Insufficient documentation

## 2015-10-17 DIAGNOSIS — R531 Weakness: Secondary | ICD-10-CM | POA: Diagnosis not present

## 2015-10-17 DIAGNOSIS — D649 Anemia, unspecified: Secondary | ICD-10-CM | POA: Diagnosis present

## 2015-10-17 DIAGNOSIS — M25512 Pain in left shoulder: Secondary | ICD-10-CM | POA: Diagnosis not present

## 2015-10-17 DIAGNOSIS — G8929 Other chronic pain: Secondary | ICD-10-CM | POA: Diagnosis not present

## 2015-10-17 DIAGNOSIS — I10 Essential (primary) hypertension: Secondary | ICD-10-CM | POA: Diagnosis not present

## 2015-10-17 DIAGNOSIS — E039 Hypothyroidism, unspecified: Secondary | ICD-10-CM | POA: Insufficient documentation

## 2015-10-17 DIAGNOSIS — Z79899 Other long term (current) drug therapy: Secondary | ICD-10-CM

## 2015-10-17 DIAGNOSIS — R0602 Shortness of breath: Secondary | ICD-10-CM | POA: Diagnosis not present

## 2015-10-17 DIAGNOSIS — K219 Gastro-esophageal reflux disease without esophagitis: Secondary | ICD-10-CM | POA: Diagnosis not present

## 2015-10-17 DIAGNOSIS — R5383 Other fatigue: Secondary | ICD-10-CM | POA: Diagnosis not present

## 2015-10-17 DIAGNOSIS — R634 Abnormal weight loss: Secondary | ICD-10-CM | POA: Insufficient documentation

## 2015-10-17 DIAGNOSIS — E559 Vitamin D deficiency, unspecified: Secondary | ICD-10-CM | POA: Diagnosis not present

## 2015-10-17 DIAGNOSIS — D6489 Other specified anemias: Secondary | ICD-10-CM

## 2015-10-17 LAB — IRON AND TIBC
Iron: 87 ug/dL (ref 28–170)
Saturation Ratios: 35 % — ABNORMAL HIGH (ref 10.4–31.8)
TIBC: 247 ug/dL — ABNORMAL LOW (ref 250–450)
UIBC: 160 ug/dL

## 2015-10-17 LAB — CBC WITH DIFFERENTIAL/PLATELET
Basophils Relative: 0 %
EOS PCT: 1 %
HEMATOCRIT: 32.5 % — AB (ref 35.0–47.0)
HEMOGLOBIN: 10.6 g/dL — AB (ref 12.0–16.0)
Lymphocytes Relative: 19 %
MCH: 21.1 pg — AB (ref 26.0–34.0)
MCHC: 32.7 g/dL (ref 32.0–36.0)
MCV: 64.6 fL — AB (ref 80.0–100.0)
Monocytes Relative: 4 %
NEUTROS PCT: 76 %
Platelets: 156 10*3/uL (ref 150–440)
RBC: 5.03 MIL/uL (ref 3.80–5.20)
RDW: 16.2 % — ABNORMAL HIGH (ref 11.5–14.5)
WBC: 6.3 10*3/uL (ref 3.6–11.0)

## 2015-10-17 LAB — RETICULOCYTES
RBC.: 5.03 MIL/uL (ref 3.80–5.20)
RETIC CT PCT: 1.8 % (ref 0.4–3.1)
Retic Count, Absolute: 90.5 10*3/uL (ref 19.0–183.0)

## 2015-10-17 LAB — LACTATE DEHYDROGENASE: LDH: 139 U/L (ref 98–192)

## 2015-10-17 LAB — FERRITIN: FERRITIN: 437 ng/mL — AB (ref 11–307)

## 2015-10-17 LAB — FOLATE: FOLATE: 10.7 ng/mL (ref >5.9)

## 2015-10-17 NOTE — Progress Notes (Signed)
Patient here today as OPNA.  Former patient of Dr. Sherrlyn HockPandit. Seen for anemia.  She rtc today as referral from Dr. Earnstine Regalubia regarding her thyroid.  Patient states she was seen in ED 2 weeks ago for fatigue and weakness.  Patient information obtained through [employer].  [place]

## 2015-10-17 NOTE — Progress Notes (Signed)
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  70 year old female patient from [place] has been referred to [place] for further evaluation of anemia.   Patient was evaluated in 2011 by Dr. 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 The patient stated that she has been tired for the last many months. He denies any blood in stools. Denies any black stools. Denies having a colonoscopy. She denies any blood in urine or vaginal bleeding. No significant weight loss. She has mild shortness of breath on exertion.  REVIEW OF SYSTEMS:  A complete 10 point review of system is done which is negative except mentioned above/history of present illness.   PAST MEDICAL HISTORY :  Past Medical History  Diagnosis Date  . Hypertension   . Vitamin D deficiency   . GERD (gastroesophageal reflux disease)   . Osteoporosis   . Glaucoma     PAST SURGICAL HISTORY :  No past surgical history on file.  FAMILY HISTORY :   Family History  Problem Relation Age of Onset  . Family history unknown: Yes    SOCIAL HISTORY:   Social History  Substance Use Topics  . Smoking status: Never Smoker   . Smokeless tobacco: Not on file  . Alcohol Use: 0.6 oz/week    1 Glasses of wine per week    ALLERGIES:  has No Known Allergies.  MEDICATIONS:  Current Outpatient Prescriptions  Medication Sig Dispense Refill  . acetaZOLAMIDE (DIAMOX) 500 MG capsule Take 500 mg by mouth 2 (two) times daily.    Marland Kitchen alendronate (FOSAMAX) 70 MG tablet Take 70 mg by mouth once a week. Take with a full glass of water on an empty stomach.    Marland Kitchen atenolol (TENORMIN) 25 MG tablet Take 25 mg by mouth daily. for high blood pressure  0  . brimonidine (ALPHAGAN) 0.2 % ophthalmic solution Place  1 drop into both eyes 2 (two) times daily.   0  . Calcium Carbonate (CALCIUM 600 PO) Take 600 mg by mouth daily.    . cholecalciferol (VITAMIN D) 1000 UNITS tablet Take 1,000 Units by mouth daily.    . dorzolamide-timolol (COSOPT) 22.3-6.8 MG/ML ophthalmic solution Place 1 drop into both eyes 2 (two) times daily.   0  . latanoprost (XALATAN) 0.005 % ophthalmic solution Place 1 drop into both eyes at bedtime.   0  . ondansetron (ZOFRAN ODT) 4 MG disintegrating tablet Take 1 tablet (4 mg total) by mouth every 8 (eight) hours as needed for nausea or vomiting. 20 tablet 0  . pantoprazole (PROTONIX) 40 MG tablet Take 40 mg by mouth daily.   0  . promethazine (PHENERGAN) 25 MG suppository Place 1 suppository (25 mg total) rectally every 6 (six) hours as needed for nausea. 12 suppository 0  . promethazine (PHENERGAN) 25 MG tablet Take 1 tablet (25 mg total) by mouth every 8 (eight) hours as needed for nausea or vomiting. 12 tablet 0   No current facility-administered medications for this visit.    PHYSICAL EXAMINATION:   BP 141/76 mmHg  Pulse 54  Temp(Src) 98.2 F (36.8 C) (Tympanic)  Resp 18  Wt 112 lb 10.5 oz (51.1 kg)  Filed Weights   10/17/15 1213  Weight: 112 lb 10.5 oz (51.1 kg)  GENERAL: Well-nourished well-developed; Alert, no distress and comfortable. Interpreter over the phone.  EYES: no pallor or icterus OROPHARYNX: no thrush or ulceration;  NECK: supple, no masses felt LYMPH:  no palpable lymphadenopathy in the cervical, axillary or inguinal regions LUNGS: clear to auscultation and  No wheeze or crackles HEART/CVS: regular rate & rhythm and no murmurs; No lower extremity edema ABDOMEN:abdomen soft, non-tender and normal bowel sounds Musculoskeletal:no cyanosis of digits and no clubbing  PSYCH: alert & oriented x 3 with fluent speech NEURO: no focal motor/sensory deficits SKIN:  no rashes or significant lesions  LABORATORY DATA:  I have reviewed the data as listed     Component Value Date/Time   NA 138 08/17/2015 2047   K 3.2* 08/17/2015 2047   CL 110 08/17/2015 2047   CO2 21* 08/17/2015 2047   GLUCOSE 115* 08/17/2015 2047   BUN 16 08/17/2015 2047   CREATININE 0.74 08/17/2015 2047   CALCIUM 8.9 08/17/2015 2047   PROT 8.0 08/17/2015 2047   ALBUMIN 4.1 08/17/2015 2047   AST 18 08/17/2015 2047   ALT 11* 08/17/2015 2047   ALKPHOS 49 08/17/2015 2047   BILITOT 1.0 08/17/2015 2047   GFRNONAA >60 08/17/2015 2047   GFRAA >60 08/17/2015 2047    No results found for: SPEP, UPEP  Lab Results  Component Value Date   WBC 10.5 08/17/2015   HGB 10.7* 08/17/2015   HCT 31.6* 08/17/2015   MCV 63.1* 08/17/2015   PLT 174 08/17/2015      Chemistry      Component Value Date/Time   NA 138 08/17/2015 2047   K 3.2* 08/17/2015 2047   CL 110 08/17/2015 2047   CO2 21* 08/17/2015 2047   BUN 16 08/17/2015 2047   CREATININE 0.74 08/17/2015 2047      Component Value Date/Time   CALCIUM 8.9 08/17/2015 2047   ALKPHOS 49 08/17/2015 2047   AST 18 08/17/2015 2047   ALT 11* 08/17/2015 2047   BILITOT 1.0 08/17/2015 2047      # Anemia- Hemoglobin between 9-11 [2011]. Microcytic. 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 No previous labs available for my review. Repeat  CBC/CMP and LDH and beta-2 folic acid stool occult 2. Also check myeloma profile. Will also check hemoglobin electrophoresis.    # Recommend the patient to follow up with me in approximately 4 weeks/ however would recommend a follow-up sooner if any of her labs are extremely abnormal/or need urgent attention.  The above plan of care was discussed with the patient detailed/ through the interpreter.   Thank you for allowing me to participate in the care of your pleasant patient. Please do not hesitate to contact me with questions or concerns in the interim.  # 30 minutes face-to-face with the patient discussing the above plan of care; more than 50% of time spent  on counseling and coordination.     Earna CoderGovinda R Brahmanday, MD 10/17/2015 12:19 PM

## 2015-10-17 NOTE — Progress Notes (Signed)
Used [employer] - Interpreter, [place] [phone] to communicate with patient.

## 2015-10-18 ENCOUNTER — Telehealth: Payer: Self-pay | Admitting: *Deleted

## 2015-10-18 NOTE — Telephone Encounter (Signed)
RN rcvd msg from [place] in Lab. Lab drawn on 4/3 was insufficient collection of blood to run the immunofixation and multiple myeloma panels. I called the patient's son to let him know. (no interpreter required for pt's son-[name] as the son speaks [place]). I explained to son that pt needs to come back this week for repeat lab draw. Son states that he is off on Thursday and Friday this week from work. He will be able to transport his mother to the lab apt on Thursday 10/20/15. I apologized to son for having to come back to cancer center. We have a patient recovery gift for patient as an inconvienance for coming back to cancer center for redraw.

## 2015-10-19 ENCOUNTER — Other Ambulatory Visit: Payer: Self-pay | Admitting: *Deleted

## 2015-10-19 DIAGNOSIS — D509 Iron deficiency anemia, unspecified: Secondary | ICD-10-CM

## 2015-10-20 ENCOUNTER — Inpatient Hospital Stay: Payer: Medicaid Other

## 2015-10-20 DIAGNOSIS — D649 Anemia, unspecified: Secondary | ICD-10-CM | POA: Diagnosis not present

## 2015-10-20 DIAGNOSIS — D509 Iron deficiency anemia, unspecified: Secondary | ICD-10-CM

## 2015-10-20 LAB — HEMOGLOBINOPATHY EVALUATION
HGB A2 QUANT: 5.8 % — AB (ref 0.7–3.1)
HGB A: 0 % — AB (ref 94.0–98.0)
HGB C: 0 %
Hgb F Quant: 0 % (ref 0.0–2.0)
Hgb S Quant: 0 %
Hgb Variant: 94.2 %

## 2015-10-20 LAB — KAPPA/LAMBDA LIGHT CHAINS
KAPPA FREE LGHT CHN: 20.88 mg/L — AB (ref 3.30–19.40)
Kappa, lambda light chain ratio: 0.96 (ref 0.26–1.65)
Lambda free light chains: 21.69 mg/L (ref 5.71–26.30)

## 2015-10-20 LAB — VITAMIN B12: Vitamin B-12: 387 pg/mL (ref 180–914)

## 2015-10-20 LAB — HAPTOGLOBIN: HAPTOGLOBIN: 61 mg/dL (ref 34–200)

## 2015-10-21 LAB — MULTIPLE MYELOMA PANEL, SERUM
ALBUMIN SERPL ELPH-MCNC: 4 g/dL (ref 2.9–4.4)
ALBUMIN/GLOB SERPL: 1.2 (ref 0.7–1.7)
ALPHA 1: 0.2 g/dL (ref 0.0–0.4)
Alpha2 Glob SerPl Elph-Mcnc: 0.6 g/dL (ref 0.4–1.0)
B-Globulin SerPl Elph-Mcnc: 1 g/dL (ref 0.7–1.3)
GAMMA GLOB SERPL ELPH-MCNC: 1.8 g/dL (ref 0.4–1.8)
Globulin, Total: 3.6 g/dL (ref 2.2–3.9)
IGA: 232 mg/dL (ref 87–352)
IGM, SERUM: 125 mg/dL (ref 26–217)
IgG (Immunoglobin G), Serum: 1719 mg/dL — ABNORMAL HIGH (ref 700–1600)
Total Protein ELP: 7.6 g/dL (ref 6.0–8.5)

## 2015-10-24 LAB — IMMUNOFIXATION ELECTROPHORESIS
IGA: 242 mg/dL (ref 87–352)
IGG (IMMUNOGLOBIN G), SERUM: 1690 mg/dL — AB (ref 700–1600)
IgM, Serum: 131 mg/dL (ref 26–217)
Total Protein ELP: 7.6 g/dL (ref 6.0–8.5)

## 2015-11-07 ENCOUNTER — Ambulatory Visit: Payer: Medicaid Other | Admitting: Internal Medicine

## 2015-11-10 ENCOUNTER — Inpatient Hospital Stay (HOSPITAL_BASED_OUTPATIENT_CLINIC_OR_DEPARTMENT_OTHER): Payer: Medicaid Other | Admitting: Internal Medicine

## 2015-11-10 VITALS — BP 118/74 | HR 46 | Temp 97.1°F | Resp 18 | Ht 62.0 in | Wt 110.2 lb

## 2015-11-10 DIAGNOSIS — M818 Other osteoporosis without current pathological fracture: Secondary | ICD-10-CM

## 2015-11-10 DIAGNOSIS — M25512 Pain in left shoulder: Secondary | ICD-10-CM | POA: Diagnosis not present

## 2015-11-10 DIAGNOSIS — D649 Anemia, unspecified: Secondary | ICD-10-CM | POA: Diagnosis not present

## 2015-11-10 DIAGNOSIS — R634 Abnormal weight loss: Secondary | ICD-10-CM | POA: Diagnosis not present

## 2015-11-10 DIAGNOSIS — I10 Essential (primary) hypertension: Secondary | ICD-10-CM

## 2015-11-10 DIAGNOSIS — E559 Vitamin D deficiency, unspecified: Secondary | ICD-10-CM

## 2015-11-10 DIAGNOSIS — R0602 Shortness of breath: Secondary | ICD-10-CM

## 2015-11-10 DIAGNOSIS — E039 Hypothyroidism, unspecified: Secondary | ICD-10-CM

## 2015-11-10 DIAGNOSIS — G8929 Other chronic pain: Secondary | ICD-10-CM

## 2015-11-10 DIAGNOSIS — R5383 Other fatigue: Secondary | ICD-10-CM

## 2015-11-10 DIAGNOSIS — R531 Weakness: Secondary | ICD-10-CM

## 2015-11-10 DIAGNOSIS — Z79899 Other long term (current) drug therapy: Secondary | ICD-10-CM

## 2015-11-10 DIAGNOSIS — D6489 Other specified anemias: Secondary | ICD-10-CM

## 2015-11-10 DIAGNOSIS — K219 Gastro-esophageal reflux disease without esophagitis: Secondary | ICD-10-CM

## 2015-11-10 NOTE — Progress Notes (Signed)
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  70 year old [place] patient from [place]- is here to review the workup for anemia that was ordered at last visit.   She continues to have chronic fatigue. Chronic mild shortness of breath. Chronic pain in the left shoulder.  She has weight loss of about 10 pounds in the last 2 months. Otherwise denies abdominal pain blood in stools black red stools. No difficulty swallowing or pain with swallowing.   REVIEW OF SYSTEMS:  A complete 10 point review of system is done which is negative except mentioned above/history of present illness.   PAST MEDICAL HISTORY :  Past Medical History  Diagnosis Date  . Hypertension   . Vitamin D deficiency   . GERD (gastroesophageal reflux disease)   . Osteoporosis   . Glaucoma     PAST SURGICAL HISTORY :  No past surgical history on file.  FAMILY HISTORY :   Family History  Problem Relation Age of Onset  . Family history unknown: Yes    SOCIAL HISTORY:   Social History  Substance Use Topics  . Smoking status: Never Smoker   . Smokeless tobacco: Not on file  . Alcohol Use: 0.6 oz/week    1 Glasses of wine per week    ALLERGIES:  has No Known Allergies.  MEDICATIONS:  Current Outpatient Prescriptions  Medication Sig Dispense Refill  . acetaZOLAMIDE (DIAMOX) 500 MG capsule Take 500 mg by mouth 2 (two) times daily.    Marland Kitchen. atenolol (TENORMIN) 25 MG tablet Take 25 mg by mouth daily. for high blood pressure  0  . brimonidine (ALPHAGAN) 0.2 % ophthalmic solution Place 1 drop into both eyes 2 (two) times daily.   0  . Calcium Carbonate (CALCIUM 600 PO) Take 600 mg by mouth daily. Reported on 10/17/2015    . cholecalciferol (VITAMIN D) 1000 UNITS tablet  Take 1,000 Units by mouth daily.    . dorzolamide-timolol (COSOPT) 22.3-6.8 MG/ML ophthalmic solution Place 1 drop into both eyes 2 (two) times daily.   0  . latanoprost (XALATAN) 0.005 % ophthalmic solution Place 1 drop into both eyes at bedtime. Reported on 10/17/2015  0  . ondansetron (ZOFRAN ODT) 4 MG disintegrating tablet Take 1 tablet (4 mg total) by mouth every 8 (eight) hours as needed for nausea or vomiting. 20 tablet 0  . pantoprazole (PROTONIX) 40 MG tablet Take 40 mg by mouth daily.   0  . promethazine (PHENERGAN) 25 MG suppository Place 1 suppository (25 mg total) rectally every 6 (six) hours as needed for nausea. 12 suppository 0  . promethazine (PHENERGAN) 25 MG tablet Take 1 tablet (25 mg total) by mouth every 8 (eight) hours as needed for nausea or vomiting. 12 tablet 0  . Vitamin D, Ergocalciferol, (DRISDOL) 50000 units CAPS capsule take 1 capsule by mouth every week for 12 WEEKS  0  . alendronate (FOSAMAX) 70 MG tablet Take 70 mg by mouth once a week. Reported on 10/17/2015     No current facility-administered medications for this visit.    PHYSICAL EXAMINATION:   BP 118/74 mmHg  Pulse 46  Temp(Src) 97.1 F (36.2 C)  Resp 18  Ht 5\' 2"  (1.575 m)  Wt 110 lb 3.7 oz (50 kg)  BMI 20.16 kg/m2  Filed Weights   11/10/15 1539  Weight: 110 lb 3.7 oz (50 kg)    GENERAL: Well-nourished well-developed; Alert, no distress and comfortable.Accompanied by son.    LABORATORY DATA:  I have reviewed the data as listed    Component Value Date/Time   NA 138 08/17/2015 2047   K 3.2* 08/17/2015 2047   CL 110 08/17/2015 2047   CO2 21* 08/17/2015 2047   GLUCOSE 115* 08/17/2015 2047   BUN 16 08/17/2015 2047   CREATININE 0.74 08/17/2015 2047   CALCIUM 8.9 08/17/2015 2047   PROT 8.0 08/17/2015 2047   ALBUMIN 4.1 08/17/2015 2047   AST 18 08/17/2015 2047   ALT 11* 08/17/2015 2047   ALKPHOS 49 08/17/2015 2047   BILITOT 1.0 08/17/2015 2047   GFRNONAA >60 08/17/2015 2047   GFRAA >60  08/17/2015 2047    No results found for: SPEP, UPEP  Lab Results  Component Value Date   WBC 6.3 10/17/2015   HGB 10.6* 10/17/2015   HCT 32.5* 10/17/2015   MCV 64.6* 10/17/2015   PLT 156 10/17/2015      Chemistry      Component Value Date/Time   NA 138 08/17/2015 2047   K 3.2* 08/17/2015 2047   CL 110 08/17/2015 2047   CO2 21* 08/17/2015 2047   BUN 16 08/17/2015 2047   CREATININE 0.74 08/17/2015 2047      Component Value Date/Time   CALCIUM 8.9 08/17/2015 2047   ALKPHOS 49 08/17/2015 2047   AST 18 08/17/2015 2047   ALT 11* 08/17/2015 2047   BILITOT 1.0 08/17/2015 2047      # Anemia- Hemoglobin between 9-11 [2011]. 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  Rest of the workup for myeloma iron deficiency negative. Patient's hemoglobin is stable at baseline 10.6. I would not recommend any further workup at this time. Patient's symptoms [chronic left shoulder pain; fatigue]- unlikely related to her chronic anemia.   # Weight loss- recent  CT A/P-neg. No new pulmonary symptoms. Deferred to PCP for further evaluation.  # Hypothyroidism- followed by PCP.   # I also spoke to patient's PCP Dr. Seward Carol regarding the above plan.  # No follow-up planned with [place].  15 minutes face-to-face with the patient/son discussing the above plan of care; more than 50% of time spent on counseling and coordination.     Earna Coder, MD 11/10/2015 4:07 PM

## 2016-04-20 ENCOUNTER — Other Ambulatory Visit: Payer: Self-pay | Admitting: Primary Care

## 2016-04-20 DIAGNOSIS — M810 Age-related osteoporosis without current pathological fracture: Secondary | ICD-10-CM

## 2016-05-23 ENCOUNTER — Ambulatory Visit
Admission: RE | Admit: 2016-05-23 | Discharge: 2016-05-23 | Disposition: A | Discharge status: Home / Self Care | Payer: Medicaid Other | Source: Ambulatory Visit | Attending: Primary Care | Admitting: Primary Care

## 2016-05-23 DIAGNOSIS — M810 Age-related osteoporosis without current pathological fracture: Secondary | ICD-10-CM | POA: Insufficient documentation

## 2017-03-01 ENCOUNTER — Other Ambulatory Visit: Payer: Self-pay | Admitting: Primary Care

## 2017-03-01 DIAGNOSIS — Z1239 Encounter for other screening for malignant neoplasm of breast: Secondary | ICD-10-CM

## 2017-03-20 ENCOUNTER — Ambulatory Visit
Admission: RE | Admit: 2017-03-20 | Discharge: 2017-03-20 | Disposition: A | Discharge status: Home / Self Care | Payer: Medicaid Other | Source: Ambulatory Visit | Attending: Primary Care | Admitting: Primary Care

## 2017-03-20 DIAGNOSIS — Z1239 Encounter for other screening for malignant neoplasm of breast: Secondary | ICD-10-CM

## 2017-03-20 DIAGNOSIS — Z1231 Encounter for screening mammogram for malignant neoplasm of breast: Secondary | ICD-10-CM | POA: Insufficient documentation

## 2017-07-10 ENCOUNTER — Observation Stay
Admission: EM | Admit: 2017-07-10 | Discharge: 2017-07-11 | Disposition: A | Discharge status: Home / Self Care | Payer: Medicaid Other | Attending: Internal Medicine | Admitting: Internal Medicine

## 2017-07-10 ENCOUNTER — Other Ambulatory Visit: Payer: Self-pay

## 2017-07-10 ENCOUNTER — Emergency Department: Payer: Medicaid Other

## 2017-07-10 ENCOUNTER — Encounter: Payer: Self-pay | Admitting: Emergency Medicine

## 2017-07-10 DIAGNOSIS — K219 Gastro-esophageal reflux disease without esophagitis: Secondary | ICD-10-CM | POA: Diagnosis present

## 2017-07-10 DIAGNOSIS — M810 Age-related osteoporosis without current pathological fracture: Secondary | ICD-10-CM | POA: Insufficient documentation

## 2017-07-10 DIAGNOSIS — I10 Essential (primary) hypertension: Secondary | ICD-10-CM | POA: Diagnosis not present

## 2017-07-10 DIAGNOSIS — E785 Hyperlipidemia, unspecified: Secondary | ICD-10-CM | POA: Diagnosis not present

## 2017-07-10 DIAGNOSIS — R112 Nausea with vomiting, unspecified: Secondary | ICD-10-CM | POA: Diagnosis present

## 2017-07-10 DIAGNOSIS — K529 Noninfective gastroenteritis and colitis, unspecified: Principal | ICD-10-CM | POA: Insufficient documentation

## 2017-07-10 DIAGNOSIS — H409 Unspecified glaucoma: Secondary | ICD-10-CM | POA: Insufficient documentation

## 2017-07-10 DIAGNOSIS — E559 Vitamin D deficiency, unspecified: Secondary | ICD-10-CM | POA: Insufficient documentation

## 2017-07-10 DIAGNOSIS — Z79899 Other long term (current) drug therapy: Secondary | ICD-10-CM | POA: Diagnosis not present

## 2017-07-10 DIAGNOSIS — E876 Hypokalemia: Secondary | ICD-10-CM | POA: Insufficient documentation

## 2017-07-10 LAB — URINALYSIS, COMPLETE (UACMP) WITH MICROSCOPIC
Bacteria, UA: NONE SEEN
Bilirubin Urine: NEGATIVE
GLUCOSE, UA: NEGATIVE mg/dL
KETONES UR: 5 mg/dL — AB
Leukocytes, UA: NEGATIVE
NITRITE: NEGATIVE
PH: 5 (ref 5.0–8.0)
PROTEIN: 30 mg/dL — AB
Specific Gravity, Urine: 1.023 (ref 1.005–1.030)
Squamous Epithelial / LPF: NONE SEEN

## 2017-07-10 LAB — COMPREHENSIVE METABOLIC PANEL
ALK PHOS: 64 U/L (ref 38–126)
ALT: 15 U/L (ref 14–54)
AST: 25 U/L (ref 15–41)
Albumin: 4.7 g/dL (ref 3.5–5.0)
Anion gap: 6 (ref 5–15)
BILIRUBIN TOTAL: 1.1 mg/dL (ref 0.3–1.2)
BUN: 23 mg/dL — ABNORMAL HIGH (ref 6–20)
CALCIUM: 9.6 mg/dL (ref 8.9–10.3)
CO2: 22 mmol/L (ref 22–32)
CREATININE: 0.89 mg/dL (ref 0.44–1.00)
Chloride: 113 mmol/L — ABNORMAL HIGH (ref 101–111)
Glucose, Bld: 109 mg/dL — ABNORMAL HIGH (ref 65–99)
Potassium: 3.2 mmol/L — ABNORMAL LOW (ref 3.5–5.1)
Sodium: 141 mmol/L (ref 135–145)
TOTAL PROTEIN: 8.7 g/dL — AB (ref 6.5–8.1)

## 2017-07-10 LAB — INFLUENZA PANEL BY PCR (TYPE A & B)
INFLAPCR: NEGATIVE
INFLBPCR: NEGATIVE

## 2017-07-10 LAB — CBC
HCT: 34.8 % — ABNORMAL LOW (ref 35.0–47.0)
Hemoglobin: 11.4 g/dL — ABNORMAL LOW (ref 12.0–16.0)
MCH: 20.9 pg — AB (ref 26.0–34.0)
MCHC: 32.6 g/dL (ref 32.0–36.0)
MCV: 64 fL — ABNORMAL LOW (ref 80.0–100.0)
PLATELETS: 211 10*3/uL (ref 150–440)
RBC: 5.44 MIL/uL — AB (ref 3.80–5.20)
RDW: 16.8 % — ABNORMAL HIGH (ref 11.5–14.5)
WBC: 8.2 10*3/uL (ref 3.6–11.0)

## 2017-07-10 LAB — LIPASE, BLOOD: Lipase: 41 U/L (ref 11–51)

## 2017-07-10 MED ORDER — ONDANSETRON HCL 4 MG/2ML IJ SOLN
4.0000 mg | Freq: Once | INTRAMUSCULAR | Status: AC
  Administered 2017-07-10: 4 mg via INTRAVENOUS
  Filled 2017-07-10: qty 2

## 2017-07-10 MED ORDER — ONDANSETRON HCL 4 MG/2ML IJ SOLN
4.0000 mg | Freq: Once | INTRAMUSCULAR | Status: DC
  Administered 2017-07-10: 4 mg via INTRAVENOUS

## 2017-07-10 MED ORDER — ONDANSETRON HCL 4 MG/2ML IJ SOLN: 4.0000 mg | Freq: Once | INTRAMUSCULAR | Status: DC

## 2017-07-10 MED ORDER — ONDANSETRON HCL 4 MG/2ML IJ SOLN
INTRAMUSCULAR | Status: AC
  Filled 2017-07-10: qty 2

## 2017-07-10 MED ORDER — SODIUM CHLORIDE 0.9 % IV SOLN
1000.0000 mL | Freq: Once | INTRAVENOUS | Status: AC
  Administered 2017-07-10: 1000 mL via INTRAVENOUS

## 2017-07-10 NOTE — ED Notes (Signed)
Patients son reports patient has been coughing a lot. Wet, non productive cough noted.

## 2017-07-10 NOTE — ED Provider Notes (Signed)
[facility] Emergency Department Provider Note   ____________________________________________    I have reviewed the triage vital signs and the nursing notes.   HISTORY  Chief Complaint Emesis     HPI Rachel [name] is a 71 y.o. female who presents with nausea vomiting and weakness.  Apparently the patient has had a cough for 2 days which has been productive.  Today she developed nausea and vomiting this morning.  Multiple episodes of vomiting throughout the day.  No significant abdominal pain, mild abdominal cramping.  No diarrhea reported.  No fevers or chills reported.  No recent travel.  Has not taken anything for this.   Past Medical History:  Diagnosis Date  . GERD (gastroesophageal reflux disease)   . Glaucoma   . Hypertension   . Osteoporosis   . Vitamin D deficiency     Patient Active Problem List   Diagnosis Date Noted  . Preop cardiovascular exam 10/07/2014  . Essential hypertension 10/07/2014  . Hyperlipidemia 10/07/2014  . Vision loss of left eye 10/07/2014    History reviewed. No pertinent surgical history.  Prior to Admission medications   Medication Sig Start Date End Date Taking? Authorizing Provider  acetaZOLAMIDE (DIAMOX) 500 MG capsule Take 500 mg by mouth 2 (two) times daily.    [provider]  alendronate (FOSAMAX) 70 MG tablet Take 70 mg by mouth once a week. Reported on 10/17/2015    [provider]  atenolol (TENORMIN) 25 MG tablet Take 25 mg by mouth daily. for high blood pressure 09/29/14   [provider]  brimonidine (ALPHAGAN) 0.2 % ophthalmic solution Place 1 drop into both eyes 2 (two) times daily.  09/02/14   [provider]  Calcium Carbonate (CALCIUM 600 PO) Take 600 mg by mouth daily. Reported on 10/17/2015    [provider]  cholecalciferol (VITAMIN D) 1000 UNITS tablet Take 1,000 Units by mouth daily.    [provider]  dorzolamide-timolol (COSOPT)  22.3-6.8 MG/ML ophthalmic solution Place 1 drop into both eyes 2 (two) times daily.  08/20/14   [provider]  latanoprost (XALATAN) 0.005 % ophthalmic solution Place 1 drop into both eyes at bedtime. Reported on 10/17/2015 08/20/14   [provider]  ondansetron (ZOFRAN ODT) 4 MG disintegrating tablet Take 1 tablet (4 mg total) by mouth every 8 (eight) hours as needed for nausea or vomiting. 08/17/15   Rockne MenghiniNorman, Anne-Caroline, MD  pantoprazole (PROTONIX) 40 MG tablet Take 40 mg by mouth daily.  09/30/14   [provider]  promethazine (PHENERGAN) 25 MG suppository Place 1 suppository (25 mg total) rectally every 6 (six) hours as needed for nausea. 08/17/15 08/16/16  Rockne MenghiniNorman, Anne-Caroline, MD  promethazine (PHENERGAN) 25 MG tablet Take 1 tablet (25 mg total) by mouth every 8 (eight) hours as needed for nausea or vomiting. 08/17/15   Rockne MenghiniNorman, Anne-Caroline, MD  Vitamin D, Ergocalciferol, (DRISDOL) 50000 units CAPS capsule take 1 capsule by mouth every week for 12 WEEKS 09/23/15   [provider]     Allergies Patient has no known allergies.  Family History  Family history unknown: Yes    Social History Social History   Tobacco Use  . Smoking status: Never Smoker  Substance Use Topics  . Alcohol use: Yes    Alcohol/week: 0.6 oz    Types: 1 Glasses of wine per week  . Drug use: No    Review of Systems  Constitutional: No fever/chills Eyes: No visual changes.  ENT:  No sore throat. Cardiovascular: Denies chest pain. Respiratory: Positive cough, no shortness of breath Gastrointestinal: As above Genitourinary: Negative for dysuria. Musculoskeletal: Negative for back pain. Skin: Negative for rash. Neurological: Negative for headaches or weakness   ____________________________________________   PHYSICAL EXAM:  VITAL SIGNS: ED Triage Vitals  Enc Vitals Group     BP 07/10/17 1732 (!) 125/98     Pulse Rate 07/10/17 1732 62     Resp 07/10/17 1732 (!) 27      Temp 07/10/17 1732 98.2 F (36.8 C)     Temp Source 07/10/17 1732 Oral     SpO2 07/10/17 1732 100 %     Weight 07/10/17 1732 49 kg (108 lb)     Height 07/10/17 1732 1.473 m (4\' 10" )     Head Circumference --      Peak Flow --      Pain Score 07/10/17 1923 8     Pain Loc --      Pain Edu? --      Excl. in GC? --     Constitutional: Alert and oriented.  Pleasant and interactive Eyes: Conjunctivae are normal.   Nose: No congestion/rhinnorhea. Mouth/Throat: Mucous membranes are moist.    Cardiovascular: Normal rate, regular rhythm. Grossly normal heart sounds.  Good peripheral circulation. Respiratory: Normal respiratory effort.  No retractions. Lungs CTAB. Gastrointestinal: Soft and nontender. No distention.  No CVA tenderness. Musculoskeletal: No lower extremity tenderness nor edema.  Warm and well perfused Neurologic:  Normal speech and language. No gross focal neurologic deficits are appreciated.  Skin:  Skin is warm, dry and intact. No rash noted. Psychiatric: Mood and affect are normal. Speech and behavior are normal.  ____________________________________________   LABS (all labs ordered are listed, but only abnormal results are displayed)  Labs Reviewed  COMPREHENSIVE METABOLIC PANEL - Abnormal; Notable for the following components:      Result Value   Potassium 3.2 (*)    Chloride 113 (*)    Glucose, Bld 109 (*)    BUN 23 (*)    Total Protein 8.7 (*)    All other components within normal limits  CBC - Abnormal; Notable for the following components:   RBC 5.44 (*)    Hemoglobin 11.4 (*)    HCT 34.8 (*)    MCV 64.0 (*)    MCH 20.9 (*)    RDW 16.8 (*)    All other components within normal limits  URINALYSIS, COMPLETE (UACMP) WITH MICROSCOPIC - Abnormal; Notable for the following components:   Color, Urine YELLOW (*)    APPearance CLEAR (*)    Hgb urine dipstick SMALL (*)    Ketones, ur 5 (*)    Protein, ur 30 (*)    All other components within normal limits    LIPASE, BLOOD  INFLUENZA PANEL BY PCR (TYPE A & B)   ____________________________________________  EKG  None ____________________________________________  RADIOLOGY  Chest x-ray unremarkable ____________________________________________   PROCEDURES  Procedure(s) performed: No  Procedures   Critical Care performed: No ____________________________________________   INITIAL IMPRESSION / ASSESSMENT AND PLAN / ED COURSE  Pertinent labs & imaging results that were available during my care of the patient were reviewed by me and considered in my medical decision making (see chart for details).  Patient presents with reports of 2 days of cough and developed nausea and vomiting today.  Also complains of fatigue and some myalgias.  We will check influenza, chest x-ray, labs  Lab work is overall reassuring,  chest x-ray normal.  Influenza test negative.  Do suspect viral gastritis, patient has continued to have nausea and vomiting after multiple doses of IV Zofran, will admit to the hospitalist service for further management    ____________________________________________   FINAL CLINICAL IMPRESSION(S) / ED DIAGNOSES  Final diagnoses:  Intractable vomiting with nausea, unspecified vomiting type        Note:  This document was prepared using Dragon voice recognition software and may include unintentional dictation errors.    Jene EveryKinner, Earsie Humm, MD 07/10/17 2142

## 2017-07-10 NOTE — H&P (Signed)
[facility] - [place] at [facility]   PATIENT NAME: Rachel [name]    MR#:  [id number]  DATE OF BIRTH:  10/01/1945  DATE OF ADMISSION:  07/10/2017  PRIMARY CARE PHYSICIAN: Oswaldo ConroyBender, Abby Daneele, MD   REQUESTING/REFERRING PHYSICIAN: Cyril LoosenKinner, MD  CHIEF COMPLAINT:   Chief Complaint  Patient presents with  . Emesis    HISTORY OF PRESENT ILLNESS:  Rachel PouChanh Varelas  is a 71 y.o. female who presents with persistent nausea vomiting and abdominal pain.  Workup appears largely within normal limits except showing some dehydration.  Hospitalist were called for admission for difficult to control nausea and vomiting  PAST MEDICAL HISTORY:   Past Medical History:  Diagnosis Date  . GERD (gastroesophageal reflux disease)   . Glaucoma   . Hypertension   . Osteoporosis   . Vitamin D deficiency     PAST SURGICAL HISTORY:  History reviewed. No pertinent surgical history.  SOCIAL HISTORY:   Social History   Tobacco Use  . Smoking status: Never Smoker  Substance Use Topics  . Alcohol use: Yes    Alcohol/week: 0.6 oz    Types: 1 Glasses of wine per week    FAMILY HISTORY:   Family History  Family history unknown: Yes    DRUG ALLERGIES:  No Known Allergies  MEDICATIONS AT HOME:   Prior to Admission medications   Medication Sig Start Date End Date Taking? Authorizing Provider  acetaZOLAMIDE (DIAMOX) 500 MG capsule Take 500 mg by mouth 2 (two) times daily.   Yes [provider]  atenolol (TENORMIN) 25 MG tablet Take 25 mg by mouth daily. for high blood pressure 09/29/14  Yes [provider]  brimonidine (ALPHAGAN) 0.2 % ophthalmic solution Place 1 drop into both eyes 2 (two) times daily.  09/02/14  Yes [provider]  Calcium Carbonate (CALCIUM 600 PO) Take 600 mg by mouth daily. Reported on 10/17/2015   Yes [provider]  cholecalciferol (VITAMIN D) 1000 UNITS tablet Take 1,000 Units by mouth daily.   Yes [provider]  dorzolamide-timolol (COSOPT) 22.3-6.8 MG/ML ophthalmic solution Place 1 drop into both eyes 2 (two) times daily.  08/20/14  Yes [provider]  pantoprazole (PROTONIX) 40 MG tablet Take 40 mg by mouth daily.  09/30/14  Yes [provider]    REVIEW OF SYSTEMS:  Review of Systems  Constitutional: Negative for chills, fever, malaise/fatigue and weight loss.  HENT: Negative for ear pain, hearing loss and tinnitus.   Eyes: Negative for blurred vision, double vision, pain and redness.  Respiratory: Negative for cough, hemoptysis and shortness of breath.   Cardiovascular: Negative for chest pain, palpitations, orthopnea and leg swelling.  Gastrointestinal: Positive for abdominal pain, nausea and vomiting. Negative for constipation and diarrhea.  Genitourinary: Negative for dysuria, frequency and hematuria.  Musculoskeletal: Negative for back pain, joint pain and neck pain.  Skin:       No acne, rash, or lesions  Neurological: Negative for dizziness, tremors, focal weakness and weakness.  Endo/Heme/Allergies: Negative for polydipsia. Does not bruise/bleed easily.  Psychiatric/Behavioral: Negative for depression. The patient is not nervous/anxious and does not have insomnia.      VITAL SIGNS:   Vitals:   07/10/17 1732 07/10/17 1923 07/10/17 2051  BP: (!) 125/98 (!) 172/84 (!) 151/58  Pulse: 62 65 64  Resp: (!) 27 (!) 24 20  Temp: 98.2 F (36.8 C) 99.5 F (37.5 C) 98.2 F (36.8 C)  TempSrc: Oral Oral Oral  SpO2:  100% 97% 100%  Weight: 49 kg (108 lb)    Height: 4\' 10"  (1.473 m)     Wt Readings from Last 3 Encounters:  07/10/17 49 kg (108 lb)  11/10/15 50 kg (110 lb 3.7 oz)  10/17/15 51.1 kg (112 lb 10.5 oz)    PHYSICAL EXAMINATION:  Physical Exam  Vitals reviewed. Constitutional: She is oriented to person, place, and time. She appears well-developed and well-nourished. No distress.  HENT:  Head: Normocephalic and atraumatic.  Dry mucous  membranes  Eyes: Conjunctivae and EOM are normal. Pupils are equal, round, and reactive to light. No scleral icterus.  Neck: Normal range of motion. Neck supple. No JVD present. No thyromegaly present.  Cardiovascular: Normal rate, regular rhythm and intact distal pulses. Exam reveals no gallop and no friction rub.  No murmur heard. Respiratory: Effort normal and breath sounds normal. No respiratory distress. She has no wheezes. She has no rales.  GI: Soft. Bowel sounds are normal. She exhibits no distension. There is tenderness.  Musculoskeletal: Normal range of motion. She exhibits no edema.  No arthritis, no gout  Lymphadenopathy:    She has no cervical adenopathy.  Neurological: She is alert and oriented to person, place, and time. No cranial nerve deficit.  No dysarthria, no aphasia  Skin: Skin is warm and dry. No rash noted. No erythema.  Psychiatric: She has a normal mood and affect. Her behavior is normal. Judgment and thought content normal.    LABORATORY PANEL:   CBC Recent Labs  Lab 07/10/17 1734  WBC 8.2  HGB 11.4*  HCT 34.8*  PLT 211   ------------------------------------------------------------------------------------------------------------------  Chemistries  Recent Labs  Lab 07/10/17 1734  NA 141  K 3.2*  CL 113*  CO2 22  GLUCOSE 109*  BUN 23*  CREATININE 0.89  CALCIUM 9.6  AST 25  ALT 15  ALKPHOS 64  BILITOT 1.1   ------------------------------------------------------------------------------------------------------------------  Cardiac Enzymes No results for input(s): TROPONINI in the last 168 hours. ------------------------------------------------------------------------------------------------------------------  RADIOLOGY:  Dg Chest 2 View  Result Date: 07/10/2017 CLINICAL DATA:  Vomiting for 2 days EXAM: CHEST  2 VIEW COMPARISON:  09/12/2015 FINDINGS: Mild scoliosis of the spine. No focal consolidation or effusion. Normal cardiomediastinal  silhouette with atherosclerosis. No pneumothorax. IMPRESSION: No active cardiopulmonary disease. Electronically Signed   By: Jasmine PangKim  Fujinaga M.D.   On: 07/10/2017 18:36    EKG:   Orders placed or performed during the hospital encounter of 08/17/15  . ED EKG  . ED EKG    IMPRESSION AND PLAN:  Principal Problem:   Gastroenteritis -likely viral in nature, with persistent nausea and vomiting which was difficult to control in the ED despite multiple rounds of antiemetics.  Patient is also dehydrated.  We will admit with IV fluids, PRN antiemetics Active Problems:   Essential hypertension -continue home meds   Hyperlipidemia -home medications   GERD (gastroesophageal reflux disease) -home dose PPI  All the records are reviewed and case discussed with ED provider. Management plans discussed with the patient and/or family.  DVT PROPHYLAXIS: SubQ lovenox  GI PROPHYLAXIS: PPI  ADMISSION STATUS: Observation  CODE STATUS: Full Code Status History    This patient does not have a recorded code status. Please follow your organizational policy for patients in this situation.      TOTAL TIME TAKING CARE OF THIS PATIENT: 40 minutes.   Cylie Dor FIELDING 07/10/2017, 10:21 PM  [facility]  Office  [phone]  CC: Primary care physician; Oswaldo ConroyBender, Abby Daneele,  MD  Note:  This document was prepared using Dragon voice recognition software and may include unintentional dictation errors.

## 2017-07-10 NOTE — ED Triage Notes (Signed)
Patient presents to ED via POV from home with c/o vomiting x 2 days. Patient denies abdominal pain. Labored breathing noted.

## 2017-07-10 NOTE — ED Notes (Signed)
Dr. Willis at bedside  

## 2017-07-10 NOTE — ED Notes (Signed)
Pt reports she has not had any episodes of emesis since medication administration reports still feels nausea, and in general not feeling well.

## 2017-07-10 NOTE — ED Notes (Signed)
Pt presents to exam room accompanied with son reports been vomited for past 2 days, has been coughing for the past 2 weeks, and sore throat. Pt has nausea just clear.

## 2017-07-11 LAB — BASIC METABOLIC PANEL
Anion gap: 9 (ref 5–15)
BUN: 20 mg/dL (ref 6–20)
CALCIUM: 8.6 mg/dL — AB (ref 8.9–10.3)
CO2: 18 mmol/L — ABNORMAL LOW (ref 22–32)
Chloride: 116 mmol/L — ABNORMAL HIGH (ref 101–111)
Creatinine, Ser: 0.81 mg/dL (ref 0.44–1.00)
GFR calc Af Amer: >60 mL/min (ref >60)
GLUCOSE: 101 mg/dL — AB (ref 65–99)
Potassium: 2.7 mmol/L — CL (ref 3.5–5.1)
Sodium: 143 mmol/L (ref 135–145)

## 2017-07-11 LAB — MAGNESIUM: MAGNESIUM: 2 mg/dL (ref 1.7–2.4)

## 2017-07-11 LAB — CBC
HCT: 31.2 % — ABNORMAL LOW (ref 35.0–47.0)
Hemoglobin: 10.2 g/dL — ABNORMAL LOW (ref 12.0–16.0)
MCH: 21.1 pg — ABNORMAL LOW (ref 26.0–34.0)
MCHC: 32.7 g/dL (ref 32.0–36.0)
MCV: 64.5 fL — ABNORMAL LOW (ref 80.0–100.0)
Platelets: 193 10*3/uL (ref 150–440)
RBC: 4.84 MIL/uL (ref 3.80–5.20)
RDW: 16.6 % — AB (ref 11.5–14.5)
WBC: 7.7 10*3/uL (ref 3.6–11.0)

## 2017-07-11 LAB — POTASSIUM: POTASSIUM: 3.8 mmol/L (ref 3.5–5.1)

## 2017-07-11 MED ORDER — POTASSIUM CHLORIDE 10 MEQ/100ML IV SOLN
10.0000 meq | INTRAVENOUS | Status: AC
  Administered 2017-07-11 (×4): 10 meq via INTRAVENOUS
  Filled 2017-07-11 (×4): qty 100

## 2017-07-11 MED ORDER — ATENOLOL 25 MG PO TABS
25.0000 mg | ORAL_TABLET | Freq: Every day | ORAL | Status: DC
  Administered 2017-07-11: 25 mg via ORAL
  Filled 2017-07-11: qty 1

## 2017-07-11 MED ORDER — PANTOPRAZOLE SODIUM 40 MG PO TBEC
40.0000 mg | DELAYED_RELEASE_TABLET | Freq: Every day | ORAL | Status: DC
  Administered 2017-07-11: 40 mg via ORAL
  Filled 2017-07-11: qty 1

## 2017-07-11 MED ORDER — ACETAMINOPHEN 325 MG PO TABS
650.0000 mg | ORAL_TABLET | Freq: Four times a day (QID) | ORAL | Status: DC | PRN
  Administered 2017-07-11: 650 mg via ORAL
  Filled 2017-07-11: qty 2

## 2017-07-11 MED ORDER — PROMETHAZINE HCL 25 MG/ML IJ SOLN
12.5000 mg | Freq: Four times a day (QID) | INTRAMUSCULAR | Status: DC | PRN
  Administered 2017-07-11: 12.5 mg via INTRAVENOUS
  Filled 2017-07-11: qty 1

## 2017-07-11 MED ORDER — ACETAMINOPHEN 650 MG RE SUPP: 650.0000 mg | Freq: Four times a day (QID) | RECTAL | Status: DC | PRN

## 2017-07-11 MED ORDER — ACETAZOLAMIDE 250 MG PO TABS
500.0000 mg | ORAL_TABLET | Freq: Two times a day (BID) | ORAL | Status: DC
  Administered 2017-07-11: 500 mg via ORAL
  Filled 2017-07-11 (×3): qty 2

## 2017-07-11 MED ORDER — ONDANSETRON HCL 4 MG/2ML IJ SOLN
4.0000 mg | Freq: Four times a day (QID) | INTRAMUSCULAR | Status: DC | PRN
  Administered 2017-07-11: 4 mg via INTRAVENOUS
  Filled 2017-07-11: qty 2

## 2017-07-11 MED ORDER — ONDANSETRON HCL 4 MG PO TABS: 4.0000 mg | ORAL_TABLET | Freq: Four times a day (QID) | ORAL | Status: DC | PRN

## 2017-07-11 MED ORDER — DORZOLAMIDE HCL-TIMOLOL MAL 2-0.5 % OP SOLN
1.0000 [drp] | Freq: Two times a day (BID) | OPHTHALMIC | Status: DC
  Administered 2017-07-11: 1 [drp] via OPHTHALMIC
  Filled 2017-07-11: qty 10

## 2017-07-11 MED ORDER — ENOXAPARIN SODIUM 40 MG/0.4ML ~~LOC~~ SOLN: 40.0000 mg | SUBCUTANEOUS | Status: DC

## 2017-07-11 MED ORDER — SODIUM CHLORIDE 0.9 % IV SOLN
INTRAVENOUS | Status: DC
  Administered 2017-07-11: 01:00:00 via INTRAVENOUS

## 2017-07-11 NOTE — Discharge Summary (Signed)
[facility] [place] at [facility]   PATIENT NAME: Rachel [name]    MR#:  [id number]  DATE OF BIRTH:  04/05/1946  DATE OF ADMISSION:  07/10/2017   ADMITTING PHYSICIAN: York Spanielharles K Willis, MD  DATE OF DISCHARGE: 07/11/2017 PRIMARY CARE PHYSICIAN: Oswaldo ConroyBender, Abby Daneele, MD   ADMISSION DIAGNOSIS:  Intractable vomiting with nausea, unspecified vomiting type [R11.2] DISCHARGE DIAGNOSIS:  Principal Problem:   Nausea & vomiting Active Problems:   Essential hypertension   Hyperlipidemia   GERD (gastroesophageal reflux disease)   Gastroenteritis  SECONDARY DIAGNOSIS:   Past Medical History:  Diagnosis Date  . GERD (gastroesophageal reflux disease)   . Glaucoma   . Hypertension   . Osteoporosis   . Vitamin D deficiency    HOSPITAL COURSE:   Gastroenteritis -likely viral in nature, with persistent nausea and vomiting which was difficult to control in the ED despite multiple rounds of antiemetics.   Improved with IV fluids, PRN antiemetics   Essential hypertension -continue home meds   Hyperlipidemia -home medications   GERD (gastroesophageal reflux disease) -home dose PP Hypokalemia.  Improved with potassium supplement. DISCHARGE CONDITIONS:  Stable, discharge to home today. CONSULTS OBTAINED:   DRUG ALLERGIES:  No Known Allergies DISCHARGE MEDICATIONS:   Allergies as of 07/11/2017   No Known Allergies     Medication List    TAKE these medications   acetaZOLAMIDE 500 MG capsule Commonly known as:  DIAMOX Take 500 mg by mouth 2 (two) times daily.   atenolol 25 MG tablet Commonly known as:  TENORMIN Take 25 mg by mouth daily. for high blood pressure   brimonidine 0.2 % ophthalmic solution Commonly known as:  ALPHAGAN Place 1 drop into both eyes 2 (two) times daily.   CALCIUM 600 PO Take 600 mg by mouth daily. Reported on 10/17/2015   cholecalciferol 1000 units tablet Commonly known as:  VITAMIN D Take 1,000 Units by mouth daily.     dorzolamide-timolol 22.3-6.8 MG/ML ophthalmic solution Commonly known as:  COSOPT Place 1 drop into both eyes 2 (two) times daily.   pantoprazole 40 MG tablet Commonly known as:  PROTONIX Take 40 mg by mouth daily.        DISCHARGE INSTRUCTIONS:  See AVS. If you experience worsening of your admission symptoms, develop shortness of breath, life threatening emergency, suicidal or homicidal thoughts you must seek medical attention immediately by calling 911 or calling your MD immediately  if symptoms less severe.  You Must read complete instructions/literature along with all the possible adverse reactions/side effects for all the Medicines you take and that have been prescribed to you. Take any new Medicines after you have completely understood and accpet all the possible adverse reactions/side effects.   Please note  You were cared for by a hospitalist during your hospital stay. If you have any questions about your discharge medications or the care you received while you were in the hospital after you are discharged, you can call the unit and asked to speak with the hospitalist on call if the hospitalist that took care of you is not available. Once you are discharged, your primary care physician will handle any further medical issues. Please note that NO REFILLS for any discharge medications will be authorized once you are discharged, as it is imperative that you return to your primary care physician (or establish a relationship with a primary care physician if you do not have one) for your aftercare needs so that they can reassess your need  for medications and monitor your lab values.    On the day of Discharge:  VITAL SIGNS:  Blood pressure (!) 144/64, pulse 66, temperature 99 F (37.2 C), temperature source Oral, resp. rate 16, height 4\' 10"  (1.473 m), weight 106 lb 9.6 oz (48.4 kg), SpO2 97 %. PHYSICAL EXAMINATION:  GENERAL:  71 y.o.-year-old patient lying in the bed with no acute  distress.  EYES: Pupils equal, round, reactive to light and accommodation. No scleral icterus. Extraocular muscles intact.  HEENT: Head atraumatic, normocephalic. Oropharynx and nasopharynx clear.  NECK:  Supple, no jugular venous distention. No thyroid enlargement, no tenderness.  LUNGS: Normal breath sounds bilaterally, no wheezing, rales,rhonchi or crepitation. No use of accessory muscles of respiration.  CARDIOVASCULAR: S1, S2 normal. No murmurs, rubs, or gallops.  ABDOMEN: Soft, non-tender, non-distended. Bowel sounds present. No organomegaly or mass.  EXTREMITIES: No pedal edema, cyanosis, or clubbing.  NEUROLOGIC: Cranial nerves II through XII are intact. Muscle strength 5/5 in all extremities. Sensation intact. Gait not checked.  PSYCHIATRIC: The patient is alert and oriented x 3.  SKIN: No obvious rash, lesion, or ulcer.  DATA REVIEW:   CBC Recent Labs  Lab 07/11/17 0413  WBC 7.7  HGB 10.2*  HCT 31.2*  PLT 193    Chemistries  Recent Labs  Lab 07/10/17 1734 07/11/17 0413 07/11/17 1324  NA 141 143  --   K 3.2* 2.7* 3.8  CL 113* 116*  --   CO2 22 18*  --   GLUCOSE 109* 101*  --   BUN 23* 20  --   CREATININE 0.89 0.81  --   CALCIUM 9.6 8.6*  --   MG  --  2.0  --   AST 25  --   --   ALT 15  --   --   ALKPHOS 64  --   --   BILITOT 1.1  --   --      Microbiology Results  No results found for this or any previous visit.  RADIOLOGY:  Dg Chest 2 View  Result Date: 07/10/2017 CLINICAL DATA:  Vomiting for 2 days EXAM: CHEST  2 VIEW COMPARISON:  09/12/2015 FINDINGS: Mild scoliosis of the spine. No focal consolidation or effusion. Normal cardiomediastinal silhouette with atherosclerosis. No pneumothorax. IMPRESSION: No active cardiopulmonary disease. Electronically Signed   By: Jasmine PangKim  Fujinaga M.D.   On: 07/10/2017 18:36     Management plans discussed with the patient, family and they are in agreement.  CODE STATUS: Full Code   TOTAL TIME TAKING CARE OF THIS  PATIENT: 25 minutes.    Shaune PollackQing Ami Mally M.D on 07/11/2017 at 2:24 PM  Between 7am to 6pm - Pager - [phone]  After 6pm go to [email or web address] - Social research officer, governmentpassword [email or web address]  [facility] [place] [facility]  Office  [phone]  CC: Primary care physician; Oswaldo ConroyBender, Abby Daneele, MD   Note: This dictation was prepared with Dragon dictation along with smaller phrase technology. Any transcriptional errors that result from this process are unintentional.

## 2017-07-11 NOTE — Progress Notes (Signed)
MD acknowledged. K+ 2.7. Orders pending

## 2017-07-11 NOTE — Progress Notes (Signed)
Potassium 2.7.. MD paged.

## 2017-07-11 NOTE — Progress Notes (Signed)
Discharge note;  Discharge instructions given to pt and pt's son. IV removed. No questions or concerns at this time.

## 2017-07-11 NOTE — Progress Notes (Signed)
Pt up to bathroom with standby assist.

## 2017-07-11 NOTE — Discharge Instructions (Signed)
Heart healthy diet. Drink Orange juice and f/u K with PCP.

## 2017-10-31 IMAGING — CR DG CHEST 2V
1 series · 2 of 2 positions shown · non-contrast
Comparison: None.

CLINICAL DATA: Two months of cough, no history of cardiopulmonary
disease

EXAM:
CHEST  2 VIEW

[Series 1: dg chest 2 view · 0.14mm/px · 2 of 2 slices shown]
[im 1/2]
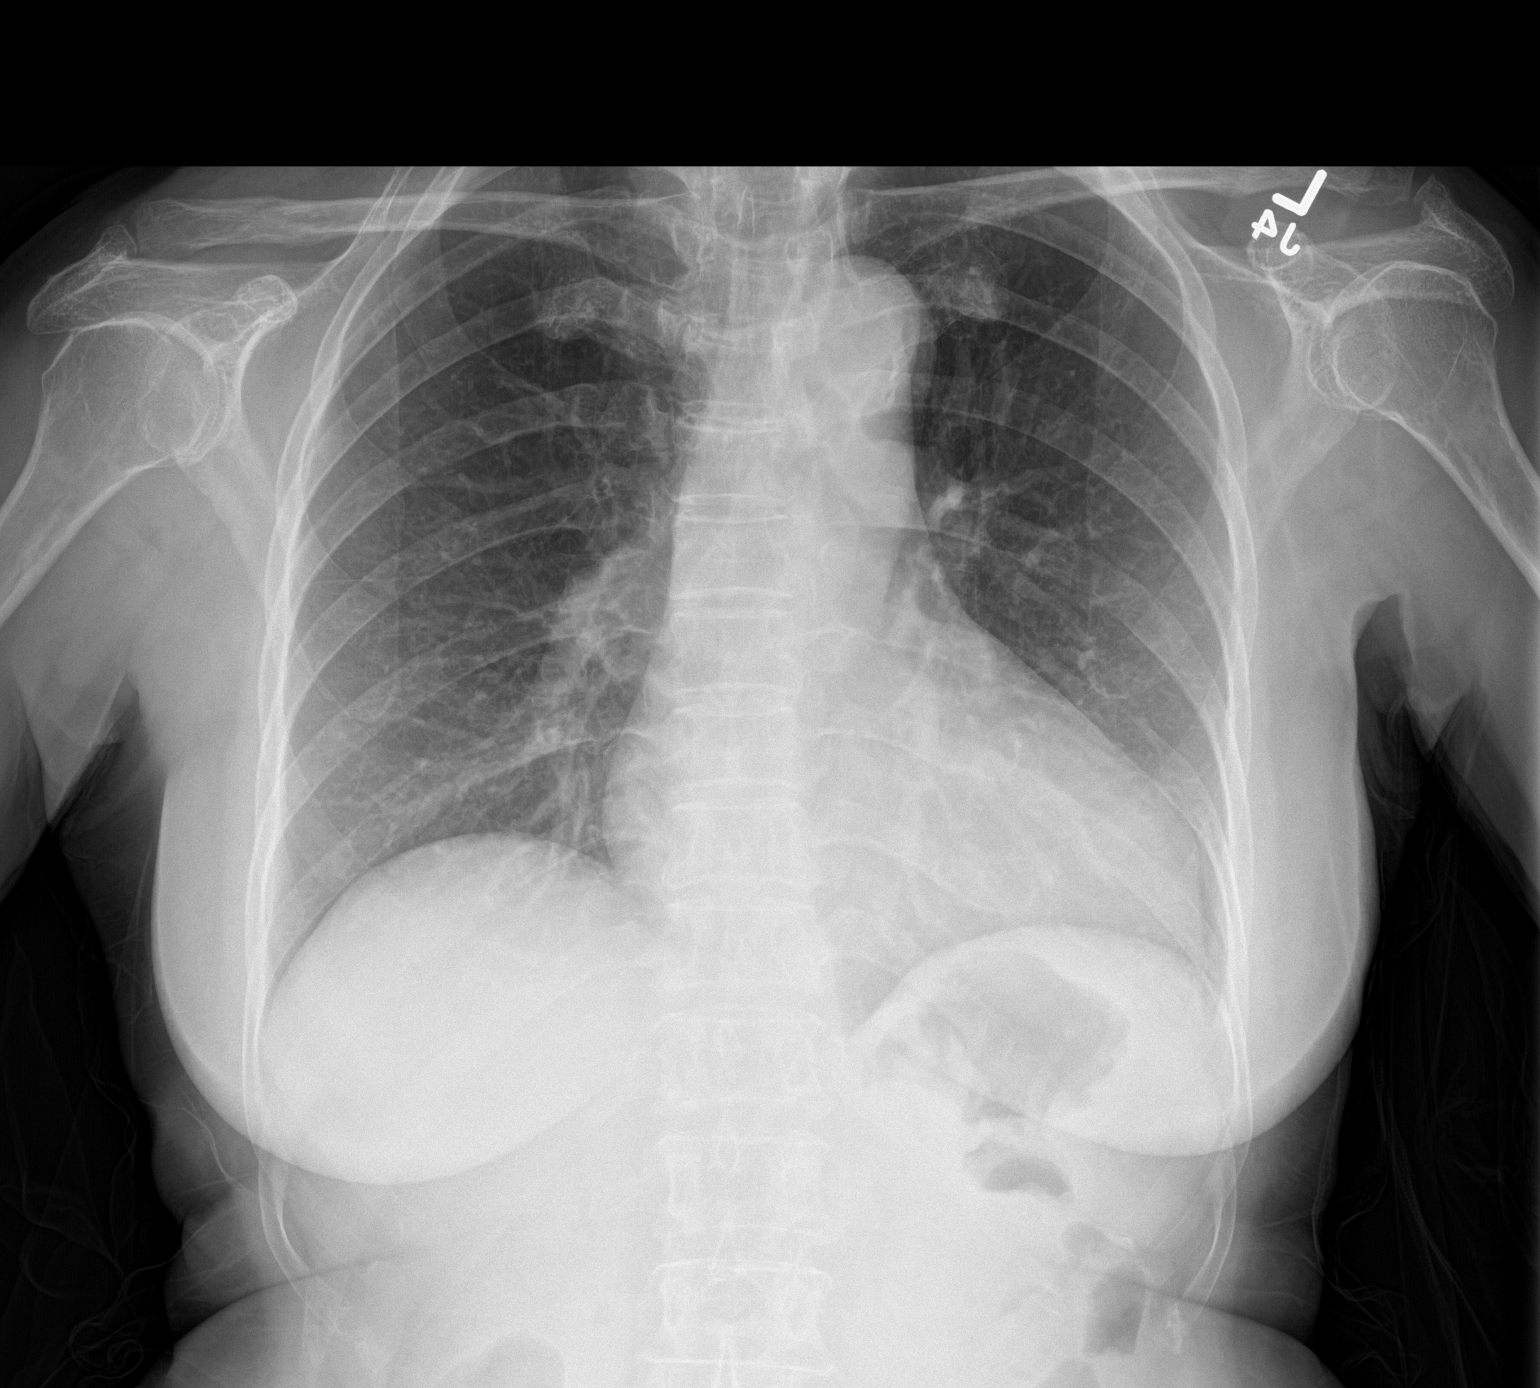
[im 2/2]
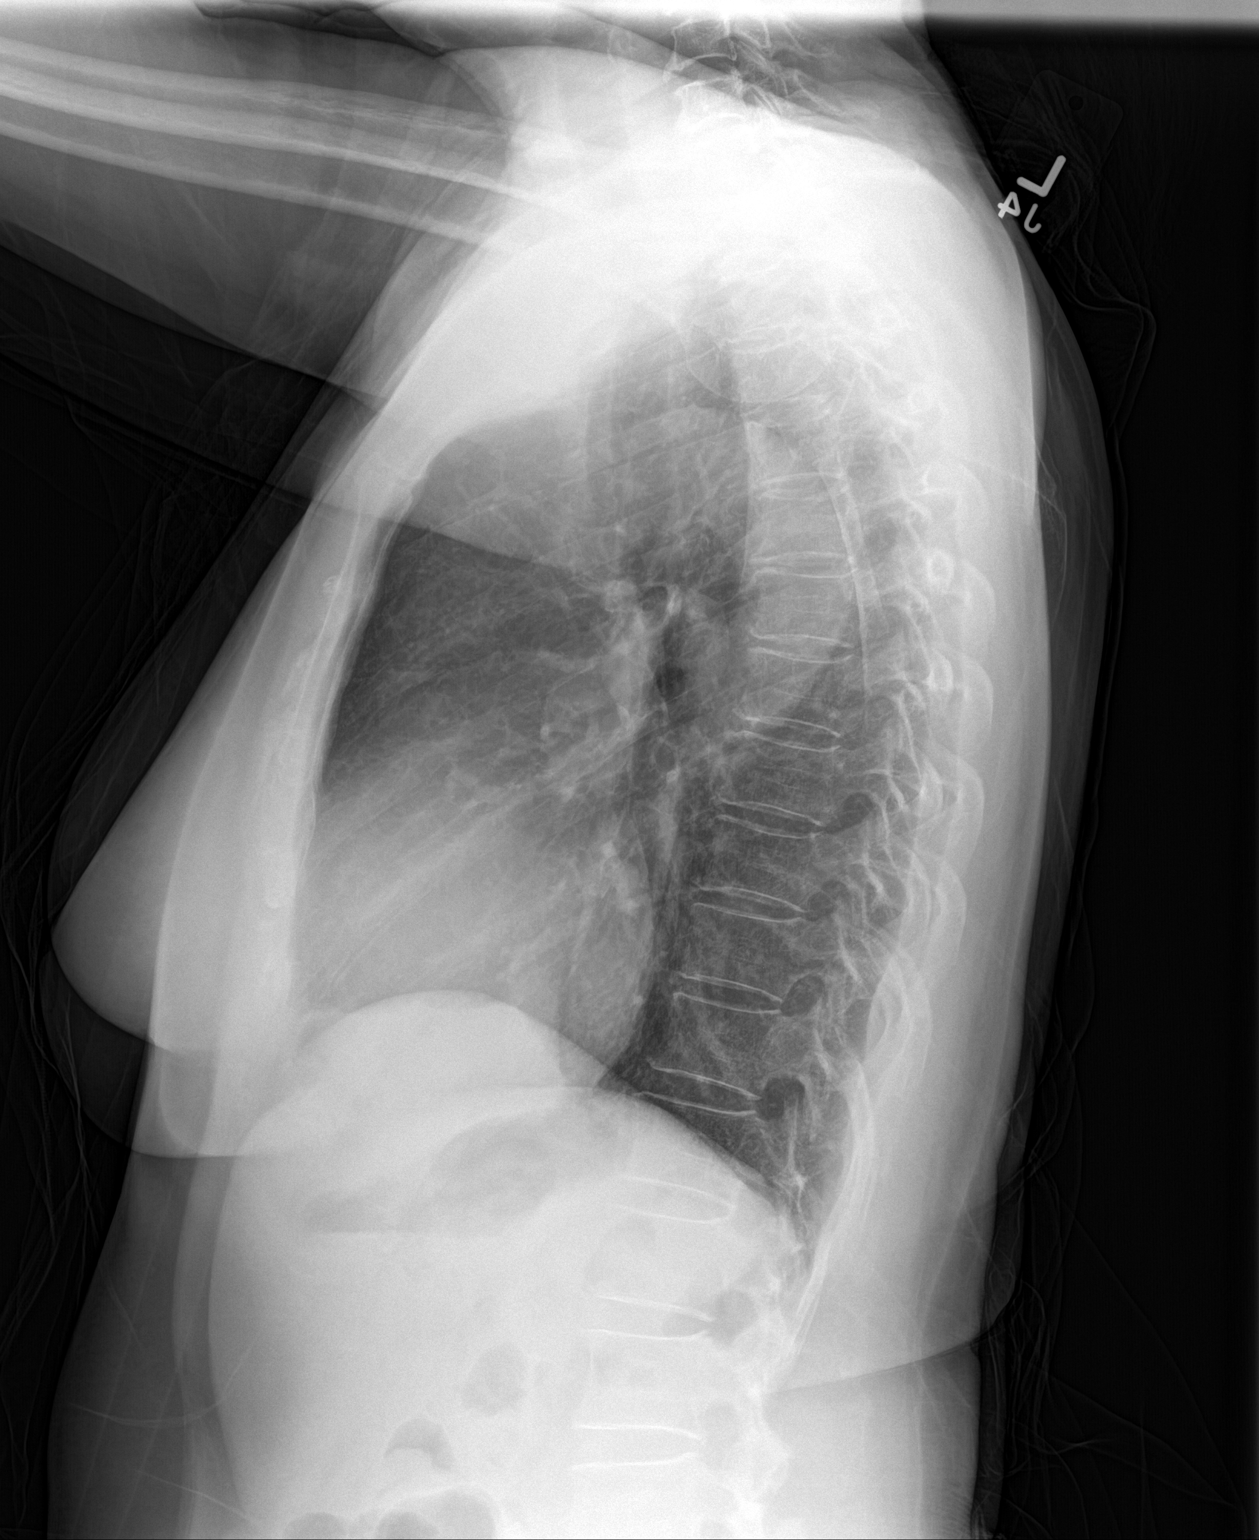

[2 of 2 positions shown; findings below may reference images not displayed]

FINDINGS: The lungs are adequately inflated. There is no focal infiltrate.
There is no pleural effusion. The heart is top-normal in size. The
pulmonary vascularity is normal. The mediastinum is normal in width.
There is gentle curvature of the mid thoracic spine convex toward
the right.
IMPRESSION: There is no acute cardiopulmonary abnormality. Given the persistent
cough, chest CT scanning is recommended.

## 2018-08-19 ENCOUNTER — Encounter: Payer: Self-pay | Admitting: *Deleted

## 2018-08-19 ENCOUNTER — Other Ambulatory Visit: Payer: Self-pay

## 2018-08-20 ENCOUNTER — Encounter: Payer: Self-pay | Admitting: Anesthesiology

## 2018-08-20 NOTE — Discharge Instructions (Signed)

## 2018-08-27 ENCOUNTER — Encounter
Admission: RE | Disposition: A | Discharge status: Home / Self Care | Payer: Self-pay | Source: Home / Self Care | Attending: Ophthalmology

## 2018-08-27 ENCOUNTER — Ambulatory Visit
Admission: RE | Admit: 2018-08-27 | Discharge: 2018-08-27 | Disposition: A | Discharge status: Home / Self Care | Payer: Medicaid Other | Attending: Ophthalmology | Admitting: Ophthalmology

## 2018-08-27 ENCOUNTER — Ambulatory Visit: Payer: Medicaid Other | Admitting: Anesthesiology

## 2018-08-27 ENCOUNTER — Encounter: Payer: Self-pay | Admitting: Anesthesiology

## 2018-08-27 DIAGNOSIS — Z79899 Other long term (current) drug therapy: Secondary | ICD-10-CM | POA: Insufficient documentation

## 2018-08-27 DIAGNOSIS — M810 Age-related osteoporosis without current pathological fracture: Secondary | ICD-10-CM | POA: Insufficient documentation

## 2018-08-27 DIAGNOSIS — K219 Gastro-esophageal reflux disease without esophagitis: Secondary | ICD-10-CM | POA: Diagnosis not present

## 2018-08-27 DIAGNOSIS — H401120 Primary open-angle glaucoma, left eye, stage unspecified: Secondary | ICD-10-CM | POA: Diagnosis present

## 2018-08-27 DIAGNOSIS — I10 Essential (primary) hypertension: Secondary | ICD-10-CM | POA: Diagnosis not present

## 2018-08-27 HISTORY — PX: PHOTOCOAGULATION WITH LASER: SHX6027

## 2018-08-27 SURGERY — PHOTOCOAGULATION, EYE, USING LASER
Anesthesia: Monitor Anesthesia Care | Site: Eye | Laterality: Left

## 2018-08-27 MED ORDER — PROPOFOL 10 MG/ML IV BOLUS
INTRAVENOUS | Status: DC | PRN
  Administered 2018-08-27: 20 mg via INTRAVENOUS

## 2018-08-27 MED ORDER — NEOMYCIN-POLYMYXIN-DEXAMETH 3.5-10000-0.1 OP OINT
TOPICAL_OINTMENT | OPHTHALMIC | Status: DC | PRN
  Administered 2018-08-27: 1 via OPHTHALMIC

## 2018-08-27 MED ORDER — OXYCODONE HCL 5 MG/5ML PO SOLN: 5.0000 mg | Freq: Once | ORAL | Status: DC | PRN

## 2018-08-27 MED ORDER — LIDOCAINE HCL 2 % IJ SOLN
INTRAMUSCULAR | Status: DC | PRN
  Administered 2018-08-27: 5 mL via OPHTHALMIC

## 2018-08-27 MED ORDER — OXYCODONE HCL 5 MG PO TABS: 5.0000 mg | ORAL_TABLET | Freq: Once | ORAL | Status: DC | PRN

## 2018-08-27 MED ORDER — LIDOCAINE HCL (CARDIAC) PF 100 MG/5ML IV SOSY
PREFILLED_SYRINGE | INTRAVENOUS | Status: DC | PRN
  Administered 2018-08-27: 30 mg via INTRATRACHEAL

## 2018-08-27 MED ORDER — LACTATED RINGERS IV SOLN: INTRAVENOUS | Status: DC

## 2018-08-27 MED ORDER — ATROPINE SULFATE 1 % OP OINT
TOPICAL_OINTMENT | OPHTHALMIC | Status: DC | PRN
  Administered 2018-08-27: 1 via OPHTHALMIC

## 2018-08-27 MED ORDER — MIDAZOLAM HCL 2 MG/2ML IJ SOLN
INTRAMUSCULAR | Status: DC | PRN
  Administered 2018-08-27: 1 mg via INTRAVENOUS

## 2018-08-27 SURGICAL SUPPLY — 10 items
DEVICE G-PROBE SGL USE (Laser) IMPLANT
G-PROBE SGL USE (Laser) ×3
GAUZE SPONGE 4X4 12PLY STRL (GAUZE/BANDAGES/DRESSINGS) ×3 IMPLANT
NDL FILTER BLUNT 18X1 1/2 (NEEDLE) ×1 IMPLANT
NDL RETROBULBAR .5 NSTRL (NEEDLE) ×3 IMPLANT
NEEDLE FILTER BLUNT 18X 1/2SAF (NEEDLE) ×2
NEEDLE FILTER BLUNT 18X1 1/2 (NEEDLE) ×1 IMPLANT
SYR 5ML LL (SYRINGE) ×3 IMPLANT
WATER STERILE IRR 250ML POUR (IV SOLUTION) ×3 IMPLANT
WATER STERILE IRR 500ML POUR (IV SOLUTION) ×2 IMPLANT

## 2018-08-27 NOTE — Anesthesia Preprocedure Evaluation (Signed)
Anesthesia Evaluation  Patient identified by MRN, date of birth, ID band  Reviewed: NPO status   History of Anesthesia Complications Negative for: history of anesthetic complications  Airway Mallampati: II  TM Distance: >3 FB Neck ROM: full    Dental no notable dental hx.    Pulmonary neg pulmonary ROS,    Pulmonary exam normal        Cardiovascular Exercise Tolerance: Good hypertension (stopped meds recently, as per PCP), Normal cardiovascular exam     Neuro/Psych Glaucoma  negative psych ROS   GI/Hepatic Neg liver ROS, GERD  Controlled,  Endo/Other  negative endocrine ROS  Renal/GU negative Renal ROS  negative genitourinary   Musculoskeletal   Abdominal   Peds  Hematology negative hematology ROS (+)   Anesthesia Other Findings Pt speaks [place]; translated by son.  Reproductive/Obstetrics                             Anesthesia Physical Anesthesia Plan  ASA: II  Anesthesia Plan: MAC   Post-op Pain Management:    Induction:   PONV Risk Score and Plan:   Airway Management Planned:   Additional Equipment:   Intra-op Plan:   Post-operative Plan:   Informed Consent: I have reviewed the patients History and Physical, chart, labs and discussed the procedure including the risks, benefits and alternatives for the proposed anesthesia with the patient or authorized representative who has indicated his/her understanding and acceptance.       Plan Discussed with: CRNA  Anesthesia Plan Comments:         Anesthesia Quick Evaluation

## 2018-08-27 NOTE — Anesthesia Postprocedure Evaluation (Signed)
Anesthesia Post Note  Patient: Rachel [name]  Procedure(s) Performed: TDC TRANSCLERAL DIODE CYCLOPHOTOCOAGULATION (Left Eye)  Patient location during evaluation: PACU Anesthesia Type: MAC Level of consciousness: awake and alert Pain management: pain level controlled Vital Signs Assessment: post-procedure vital signs reviewed and stable Respiratory status: spontaneous breathing, nonlabored ventilation, respiratory function stable and patient connected to nasal cannula oxygen Cardiovascular status: stable and blood pressure returned to baseline Postop Assessment: no apparent nausea or vomiting Anesthetic complications: no    Val Farnam

## 2018-08-27 NOTE — H&P (Signed)
The History and Physical notes are on paper, have been signed, and are to be scanned. The patient remains stable and unchanged from the H&P.   Previous H&P reviewed, patient examined, and there are no changes.  Rachel [name] 08/27/2018 8:11 AM
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
## 2018-08-27 NOTE — Transfer of Care (Signed)
Immediate Anesthesia Transfer of Care Note  Patient: Rachel [name]  Procedure(s) Performed: TDC TRANSCLERAL DIODE CYCLOPHOTOCOAGULATION (Left Eye)  Patient Location: PACU  Anesthesia Type: MAC  Level of Consciousness: awake, alert  and patient cooperative  Airway and Oxygen Therapy: Patient Spontanous Breathing and Patient connected to supplemental oxygen  Post-op Assessment: Post-op Vital signs reviewed, Patient's Cardiovascular Status Stable, Respiratory Function Stable, Patent Airway and No signs of Nausea or vomiting  Post-op Vital Signs: Reviewed and stable  Complications: No apparent anesthesia complications

## 2018-08-27 NOTE — Op Note (Signed)
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   PROCEDURES PERFORMED: Transscleral diode cyclophotocoagulation, left eye  SURGEON: [place] Sema Stangler, M.D.  ANESTHESIA: Retrobulbar block of Xylocaine and Bupivacaine and Hyaluronidase  COMPLICATIONS: None.  INDICATIONS FOR PROCEDURE: Rachel [name] is a 73 y.o. year-old female with uncontrolled primary open angle glaucoma. The risks and benefits of glaucoma surgery were discussed with the patient, and she consented for a diode laser surgery.  PROCEDURE IN DETAIL: The eye for surgery was verified during the time-out procedure in the operating room. A retrobulbar block of lidocaine, Marcaine, and hyaluronidase was done for anesthesia. A n Iridex G- probe was applied to the perilimbal sclera with the following settings: 1271mW,4 seconds. The superior, nasal, and idferior quadrants were treated with a total of 19 applications.  The laser energy was titrrated between 1250 mW and 1700 mW. Atropine and Maxitrol ointment were applied and the eye was pressure patched closed. The patient tolerated the procedure well and was transferred to the Post-operative Care Unit in stable condition.

## 2018-08-28 ENCOUNTER — Encounter: Payer: Self-pay | Admitting: Ophthalmology

## 2018-11-24 ENCOUNTER — Emergency Department: Payer: Medicaid Other

## 2018-11-24 ENCOUNTER — Encounter: Payer: Self-pay | Admitting: *Deleted

## 2018-11-24 ENCOUNTER — Observation Stay
Admission: EM | Admit: 2018-11-24 | Discharge: 2018-11-25 | Disposition: A | Discharge status: Home / Self Care | Payer: Medicaid Other | Attending: Internal Medicine | Admitting: Internal Medicine

## 2018-11-24 ENCOUNTER — Other Ambulatory Visit: Payer: Self-pay

## 2018-11-24 DIAGNOSIS — H409 Unspecified glaucoma: Secondary | ICD-10-CM | POA: Insufficient documentation

## 2018-11-24 DIAGNOSIS — E785 Hyperlipidemia, unspecified: Secondary | ICD-10-CM | POA: Insufficient documentation

## 2018-11-24 DIAGNOSIS — Z1159 Encounter for screening for other viral diseases: Secondary | ICD-10-CM | POA: Insufficient documentation

## 2018-11-24 DIAGNOSIS — R5383 Other fatigue: Secondary | ICD-10-CM

## 2018-11-24 DIAGNOSIS — Z79899 Other long term (current) drug therapy: Secondary | ICD-10-CM | POA: Diagnosis not present

## 2018-11-24 DIAGNOSIS — R2681 Unsteadiness on feet: Secondary | ICD-10-CM | POA: Insufficient documentation

## 2018-11-24 DIAGNOSIS — I491 Atrial premature depolarization: Secondary | ICD-10-CM | POA: Diagnosis not present

## 2018-11-24 DIAGNOSIS — H548 Legal blindness, as defined in USA: Secondary | ICD-10-CM | POA: Insufficient documentation

## 2018-11-24 DIAGNOSIS — I10 Essential (primary) hypertension: Secondary | ICD-10-CM | POA: Diagnosis not present

## 2018-11-24 DIAGNOSIS — E559 Vitamin D deficiency, unspecified: Secondary | ICD-10-CM | POA: Insufficient documentation

## 2018-11-24 DIAGNOSIS — K219 Gastro-esophageal reflux disease without esophagitis: Secondary | ICD-10-CM | POA: Insufficient documentation

## 2018-11-24 DIAGNOSIS — G459 Transient cerebral ischemic attack, unspecified: Secondary | ICD-10-CM | POA: Diagnosis not present

## 2018-11-24 DIAGNOSIS — H5461 Unqualified visual loss, right eye, normal vision left eye: Secondary | ICD-10-CM

## 2018-11-24 DIAGNOSIS — M810 Age-related osteoporosis without current pathological fracture: Secondary | ICD-10-CM | POA: Diagnosis not present

## 2018-11-24 LAB — COMPREHENSIVE METABOLIC PANEL
ALT: 16 U/L (ref 0–44)
AST: 23 U/L (ref 15–41)
Albumin: 4.6 g/dL (ref 3.5–5.0)
Alkaline Phosphatase: 54 U/L (ref 38–126)
Anion gap: 10 (ref 5–15)
BUN: 26 mg/dL — ABNORMAL HIGH (ref 8–23)
CO2: 16 mmol/L — ABNORMAL LOW (ref 22–32)
Calcium: 8.8 mg/dL — ABNORMAL LOW (ref 8.9–10.3)
Chloride: 114 mmol/L — ABNORMAL HIGH (ref 98–111)
Creatinine, Ser: 0.98 mg/dL (ref 0.44–1.00)
GFR calc Af Amer: >60 mL/min (ref >60)
GFR calc non Af Amer: 58 mL/min — ABNORMAL LOW (ref >60)
Glucose, Bld: 99 mg/dL (ref 70–99)
Potassium: 3.7 mmol/L (ref 3.5–5.1)
Sodium: 140 mmol/L (ref 135–145)
Total Bilirubin: 0.7 mg/dL (ref 0.3–1.2)
Total Protein: 7.7 g/dL (ref 6.5–8.1)

## 2018-11-24 LAB — URINALYSIS, COMPLETE (UACMP) WITH MICROSCOPIC
Bacteria, UA: NONE SEEN
Bilirubin Urine: NEGATIVE
Glucose, UA: NEGATIVE mg/dL
Hgb urine dipstick: NEGATIVE
Ketones, ur: NEGATIVE mg/dL
Leukocytes,Ua: NEGATIVE
Nitrite: NEGATIVE
Protein, ur: NEGATIVE mg/dL
Specific Gravity, Urine: 1.005 (ref 1.005–1.030)
Squamous Epithelial / HPF: NONE SEEN (ref 0–5)
pH: 6 (ref 5.0–8.0)

## 2018-11-24 LAB — LIPID PANEL
Cholesterol: 212 mg/dL — ABNORMAL HIGH (ref 0–200)
HDL: 78 mg/dL (ref >40)
LDL Cholesterol: 111 mg/dL — ABNORMAL HIGH (ref 0–99)
Total CHOL/HDL Ratio: 2.7 RATIO
Triglycerides: 116 mg/dL (ref <150)
VLDL: 23 mg/dL (ref 0–40)

## 2018-11-24 LAB — CBC WITH DIFFERENTIAL/PLATELET
Abs Immature Granulocytes: 0.02 10*3/uL (ref 0.00–0.07)
Basophils Absolute: 0 10*3/uL (ref 0.0–0.1)
Basophils Relative: 1 %
Eosinophils Absolute: 0.1 10*3/uL (ref 0.0–0.5)
Eosinophils Relative: 1 %
HCT: 32.8 % — ABNORMAL LOW (ref 36.0–46.0)
Hemoglobin: 10.9 g/dL — ABNORMAL LOW (ref 12.0–15.0)
Immature Granulocytes: 0 %
Lymphocytes Relative: 19 %
Lymphs Abs: 1.3 10*3/uL (ref 0.7–4.0)
MCH: 21.3 pg — ABNORMAL LOW (ref 26.0–34.0)
MCHC: 33.2 g/dL (ref 30.0–36.0)
MCV: 64.1 fL — ABNORMAL LOW (ref 80.0–100.0)
Monocytes Absolute: 0.4 10*3/uL (ref 0.1–1.0)
Monocytes Relative: 6 %
Neutro Abs: 5 10*3/uL (ref 1.7–7.7)
Neutrophils Relative %: 73 %
Platelets: 174 10*3/uL (ref 150–400)
RBC: 5.12 MIL/uL — ABNORMAL HIGH (ref 3.87–5.11)
RDW: 15.1 % (ref 11.5–15.5)
Smear Review: NORMAL
WBC: 6.8 10*3/uL (ref 4.0–10.5)
nRBC: 0 % (ref 0.0–0.2)

## 2018-11-24 LAB — SARS CORONAVIRUS 2 BY RT PCR (HOSPITAL ORDER, PERFORMED IN ~~LOC~~ HOSPITAL LAB): SARS Coronavirus 2: NEGATIVE

## 2018-11-24 LAB — PROTIME-INR
INR: 1 (ref 0.8–1.2)
Prothrombin Time: 13.3 seconds (ref 11.4–15.2)

## 2018-11-24 LAB — TROPONIN I: Troponin I: <0.03 ng/mL (ref <0.03)

## 2018-11-24 MED ORDER — PANTOPRAZOLE SODIUM 40 MG PO TBEC
40.0000 mg | DELAYED_RELEASE_TABLET | Freq: Every day | ORAL | Status: DC
  Administered 2018-11-25: 09:00:00 40 mg via ORAL
  Filled 2018-11-24: qty 1

## 2018-11-24 MED ORDER — ENOXAPARIN SODIUM 40 MG/0.4ML ~~LOC~~ SOLN
40.0000 mg | SUBCUTANEOUS | Status: DC
  Administered 2018-11-25: 02:00:00 40 mg via SUBCUTANEOUS
  Filled 2018-11-24: qty 0.4

## 2018-11-24 MED ORDER — CALCIUM CARBONATE-VITAMIN D 500-200 MG-UNIT PO TABS
1.0000 | ORAL_TABLET | Freq: Two times a day (BID) | ORAL | Status: DC
  Administered 2018-11-25: 1 via ORAL
  Filled 2018-11-24: qty 1

## 2018-11-24 MED ORDER — LATANOPROST 0.005 % OP SOLN
1.0000 [drp] | Freq: Every day | OPHTHALMIC | Status: DC
  Administered 2018-11-25: 02:00:00 1 [drp] via OPHTHALMIC
  Filled 2018-11-24: qty 2.5

## 2018-11-24 MED ORDER — SODIUM CHLORIDE 0.9 % IV SOLN
INTRAVENOUS | Status: AC
  Administered 2018-11-25: 03:00:00 via INTRAVENOUS

## 2018-11-24 MED ORDER — BRIMONIDINE TARTRATE 0.2 % OP SOLN
1.0000 [drp] | Freq: Two times a day (BID) | OPHTHALMIC | Status: DC
  Administered 2018-11-25 (×2): 1 [drp] via OPHTHALMIC
  Filled 2018-11-24: qty 5

## 2018-11-24 MED ORDER — SENNOSIDES-DOCUSATE SODIUM 8.6-50 MG PO TABS: 1.0000 | ORAL_TABLET | Freq: Every evening | ORAL | Status: DC | PRN

## 2018-11-24 MED ORDER — ASPIRIN 300 MG RE SUPP: 300.0000 mg | Freq: Every day | RECTAL | Status: DC

## 2018-11-24 MED ORDER — STROKE: EARLY STAGES OF RECOVERY BOOK
Freq: Once | Status: AC
  Administered 2018-11-25: 02:00:00

## 2018-11-24 MED ORDER — ACETAMINOPHEN 160 MG/5ML PO SOLN
650.0000 mg | ORAL | Status: DC | PRN
  Filled 2018-11-24: qty 20.3

## 2018-11-24 MED ORDER — ATORVASTATIN CALCIUM 20 MG PO TABS: 40.0000 mg | ORAL_TABLET | Freq: Every day | ORAL | Status: DC

## 2018-11-24 MED ORDER — ACETAMINOPHEN 650 MG RE SUPP: 650.0000 mg | RECTAL | Status: DC | PRN

## 2018-11-24 MED ORDER — ASPIRIN EC 325 MG PO TBEC
325.0000 mg | DELAYED_RELEASE_TABLET | Freq: Every day | ORAL | Status: DC
  Administered 2018-11-25: 325 mg via ORAL
  Filled 2018-11-24: qty 1

## 2018-11-24 MED ORDER — ACETAZOLAMIDE ER 500 MG PO CP12
500.0000 mg | ORAL_CAPSULE | Freq: Two times a day (BID) | ORAL | Status: DC
  Administered 2018-11-25 (×2): 500 mg via ORAL
  Filled 2018-11-24 (×3): qty 1

## 2018-11-24 MED ORDER — DORZOLAMIDE HCL-TIMOLOL MAL 2-0.5 % OP SOLN
1.0000 [drp] | Freq: Two times a day (BID) | OPHTHALMIC | Status: DC
  Administered 2018-11-25 (×2): 1 [drp] via OPHTHALMIC
  Filled 2018-11-24: qty 10

## 2018-11-24 MED ORDER — STROKE: EARLY STAGES OF RECOVERY BOOK: Freq: Once | Status: DC

## 2018-11-24 MED ORDER — ACETAMINOPHEN 325 MG PO TABS: 650.0000 mg | ORAL_TABLET | ORAL | Status: DC | PRN

## 2018-11-24 NOTE — ED Notes (Signed)
RN called and updated pts emergency contact per patient's request.

## 2018-11-24 NOTE — ED Provider Notes (Signed)
[facility] Emergency Department Provider Note  ____________________________________________  Time seen: Approximately 4:23 PM  I have reviewed the triage vital signs and the nursing notes.   HISTORY  Chief Complaint Eye Problem  Laotian interpreter used  HPI Rachel [name] is a 73 y.o. female who presents the emergency department complaining of intermittent right eye vision loss.  Per the patient, over the past 2 days she has had multiple episodes of vision loss to the right eye.  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  Patient reports that things went "dark/blurry" to the right eye.  On Saturday she had 3 episodes where she lost vision for an hour.  Patient reports that it would return however.  Yesterday, patient had multiple episodes of vision loss lasting approximately 10 minutes apiece.  Patient saw ophthalmology today, she reports that ophthalmologist saw no indication for vision loss on the eye exam and referred her to the emergency department to be evaluated for stroke.  Patient denies any headache, numbness and tingling in her face, one side of the body versus the other.  No strength inequality between the left and right.  Patient denies any difficulty formulating words or thoughts.  No history of previous TIAs or CVA.  Patient does have a history of hypertension, glaucoma causing the left-sided vision loss.  No medications given for this complaint over the past 2 days.  Patient denies any trauma to the head or neck region.  Patient does take atenolol for her blood pressure.  No other complaints at this time.         Past Medical History:  Diagnosis Date  . GERD (gastroesophageal reflux disease)   . Glaucoma   . Hypertension   . Osteoporosis   . Vitamin D deficiency     Patient Active Problem List   Diagnosis Date Noted  . Nausea & vomiting 07/10/2017  . GERD (gastroesophageal reflux disease) 07/10/2017  .  Gastroenteritis 07/10/2017  . Preop cardiovascular exam 10/07/2014  . Essential hypertension 10/07/2014  . Hyperlipidemia 10/07/2014  . Vision loss of left eye 10/07/2014    Past Surgical History:  Procedure Laterality Date  . ABDOMINAL HYSTERECTOMY    . CATARACT EXTRACTION W/ INTRAOCULAR LENS IMPLANT    . PHOTOCOAGULATION WITH LASER Left 08/27/2018   Procedure: TDC TRANSCLERAL DIODE CYCLOPHOTOCOAGULATION;  Surgeon: Lockie Mola, MD;  Location: [facility];  Service: Ophthalmology;  Laterality: Left;    Prior to Admission medications   Medication Sig Start Date End Date Taking? Authorizing Provider  acetaZOLAMIDE (DIAMOX) 500 MG capsule Take 500 mg by mouth 2 (two) times daily.    [provider]  atenolol (TENORMIN) 25 MG tablet Take 25 mg by mouth daily. for high blood pressure 09/29/14   [provider]  brimonidine (ALPHAGAN) 0.2 % ophthalmic solution Place 1 drop into both eyes 2 (two) times daily.  09/02/14   [provider]  Calcium Carbonate (CALCIUM 600 PO) Take 600 mg by mouth daily. Reported on 10/17/2015    [provider]  cholecalciferol (VITAMIN D) 1000 UNITS tablet Take 1,000 Units by mouth daily.    [provider]  dorzolamide-timolol (COSOPT) 22.3-6.8 MG/ML ophthalmic solution Place 1 drop into both eyes 2 (two) times daily.  08/20/14   [provider]  latanoprost (XALATAN) 0.005 % ophthalmic solution Place 1 drop into both eyes at bedtime.    [provider]  pantoprazole (PROTONIX) 40 MG tablet Take 40  mg by mouth daily.  09/30/14   [provider]    Allergies Patient has no known allergies.  Family History  Family history unknown: Yes    Social History Social History   Tobacco Use  . Smoking status: Never Smoker  . Smokeless tobacco: Never Used  Substance Use Topics  . Alcohol use: Not Currently    Alcohol/week: 0.0 standard drinks  . Drug use: No     Review of  Systems  Constitutional: No fever/chills Eyes: Positive for intermittent vision loss to the right eye.  Patient is legally blind in the left.  No discharge ENT: No upper respiratory complaints. Cardiovascular: no chest pain. Respiratory: no cough. No SOB. Gastrointestinal: No abdominal pain.  No nausea, no vomiting.  Musculoskeletal: Negative for musculoskeletal pain. Skin: Negative for rash, abrasions, lacerations, ecchymosis. Neurological: Negative for headaches, focal weakness or numbness. 10-point ROS otherwise negative.  ____________________________________________   PHYSICAL EXAM:  VITAL SIGNS: ED Triage Vitals  Enc Vitals Group     BP 11/24/18 1617 (!) 183/84     Pulse Rate 11/24/18 1617 (!) 57     Resp 11/24/18 1617 20     Temp 11/24/18 1617 98.5 F (36.9 C)     Temp Source 11/24/18 1617 Oral     SpO2 11/24/18 1617 98 %     Weight 11/24/18 1613 140 lb (63.5 kg)     Height 11/24/18 1613 5\' 1"  (1.549 m)     Head Circumference --      Peak Flow --      Pain Score 11/24/18 1613 9     Pain Loc --      Pain Edu? --      Excl. in GC? --      Constitutional: Alert and oriented. Well appearing and in no acute distress. Eyes: Conjunctivae are normal.  Right pupil is dilated from optometry.. EOMI. funduscopic exam reveals good red reflex, vasculature and optic disc without any acute abnormality.  No papilledema identified. Head: Atraumatic. ENT:      Ears:       Nose: No congestion/rhinnorhea.      Mouth/Throat: Mucous membranes are moist.  Neck: No stridor.  No cervical spine tenderness to palpation.  Cardiovascular: Normal rate, regular rhythm. Normal S1 and S2.  Good peripheral circulation. Respiratory: Normal respiratory effort without tachypnea or retractions. Lungs CTAB. Good air entry to the bases with no decreased or absent breath sounds. Musculoskeletal: Full range of motion to all extremities. No gross deformities appreciated. Neurologic:  Normal speech and  language. No gross focal neurologic deficits are appreciated.  Cranial nerve testing of nerves II through XII grossly intact.  Negative Romberg's and pronator drift. Skin:  Skin is warm, dry and intact. No rash noted. Psychiatric: Mood and affect are normal. Speech and behavior are normal. Patient exhibits appropriate insight and judgement.   ____________________________________________   LABS (all labs ordered are listed, but only abnormal results are displayed)  Labs Reviewed  COMPREHENSIVE METABOLIC PANEL - Abnormal; Notable for the following components:      Result Value   Chloride 114 (*)    CO2 16 (*)    BUN 26 (*)    Calcium 8.8 (*)    GFR calc non Af Amer 58 (*)    All other components within normal limits  CBC WITH DIFFERENTIAL/PLATELET - Abnormal; Notable for the following components:   RBC 5.12 (*)    Hemoglobin 10.9 (*)    HCT 32.8 (*)  MCV 64.1 (*)    MCH 21.3 (*)    All other components within normal limits  SARS CORONAVIRUS 2 (HOSPITAL ORDER, PERFORMED IN [facility])  PROTIME-INR  TROPONIN I  URINALYSIS, COMPLETE (UACMP) WITH MICROSCOPIC   ____________________________________________  EKG   ____________________________________________  RADIOLOGY I personally viewed and evaluated these images as part of my medical decision making, as well as reviewing the written report by the radiologist.  I concur with radiologist finding of no acute intracranial abnormality on CT.  Ct Head Wo Contrast  Result Date: 11/24/2018 CLINICAL DATA:  Recurrent visual loss. Possible stroke. EXAM: CT HEAD WITHOUT CONTRAST TECHNIQUE: Contiguous axial images were obtained from the base of the skull through the vertex without intravenous contrast. COMPARISON:  None. FINDINGS: Brain: There is no evidence of acute infarct, intracranial hemorrhage, mass, midline shift, or extra-axial fluid collection. Patchy cerebral white matter hypodensities are nonspecific but compatible  with moderately age advanced chronic small vessel ischemic disease. The ventricles and sulci are within normal limits for age. Vascular: Calcified atherosclerosis at the skull base. No hyperdense vessel. Skull: No fracture or focal osseous lesion. Sinuses/Orbits: Partially visualized punctate metallic densities anteriorly in both globes, possibly glaucoma treatment devices. Visualized paranasal sinuses and mastoid air cells are clear. Other: None. IMPRESSION: 1. No evidence of acute intracranial abnormality. 2. Moderate chronic small vessel ischemic disease. Electronically Signed   By: Sebastian Ache M.D.   On: 11/24/2018 17:25    ____________________________________________    PROCEDURES  Procedure(s) performed:    Procedures    Medications - No data to display   ____________________________________________   INITIAL IMPRESSION / ASSESSMENT AND PLAN / ED COURSE  Pertinent labs & imaging results that were available during my care of the patient were reviewed by me and considered in my medical decision making (see chart for details).  Review of the [place] CSRS was performed in accordance of the [facility] prior to dispensing any controlled drugs.           Patient's diagnosis is consistent with vision loss of the right eye.  Patient presented to the emergency department from ophthalmology for complaint of intermittent vision loss in the right eye.  According to the patient, ophthalmology found no explanation for vision loss on exam and had been referred to the emergency department for evaluation of stroke.  Patient was neurologically intact.  At this time, patient was not experiencing vision loss.  Given patient's complaints that she was evaluated with labs and CT scan of the head.  CT returned with reassuring results with no indication of hemorrhagic for ischemic stroke.  However given the symptoms especially in the presence of patient being legally blind in the left eye, I felt that further  investigation with MRI and possible ultrasounds of the carotids would be reasonable.  I discussed the case with the hospitalist service who agrees to admit the patient for further evaluation.  Patient care will be transferred to the hospital service at this time..     ____________________________________________  FINAL CLINICAL IMPRESSION(S) / ED DIAGNOSES  Final diagnoses:  Vision loss of right eye      NEW MEDICATIONS STARTED DURING THIS VISIT:  ED Discharge Orders    None          This chart was dictated using voice recognition software/Dragon. Despite best efforts to proofread, errors can occur which can change the meaning. Any change was purely unintentional.    Racheal Patches, PA-C 11/24/18 1850  Dionne Bucy, MD 11/24/18 1930

## 2018-11-24 NOTE — ED Notes (Signed)
Report given to [place], Charity fundraiser. Cherylynn Ridges requested that swallow screen be done prior to bringing the patient up to the floor.

## 2018-11-24 NOTE — Progress Notes (Signed)
This patient has intermittent visual problems and following with ophthalmologist since long time.  She was also on intravitreous injections at some point but stopped due to side effects. She continued to have this problem and was seen in ophthalmology clinic but there was no clear reason found by them-so sent to emergency room for work-up of stroke. Patient denies any other complaints.  I have discussed the case and findings with the nurse practitioner and agree with the plan.  We will admit to medical services for further stroke work-up including MRI and MRA of head, echocardiogram and carotid Doppler studies. We will get neurology consult. Her symptoms are intermittent, so it may be TIA but does not seem to be acute stroke.

## 2018-11-24 NOTE — ED Triage Notes (Addendum)
Pt ambulatory to triage.  Pt sent from [facility] for eval of stroke.  Pt loss vision 3 times on Saturday   Pt continues to have visual problems today.  No headache.  Pt alert  Speech clear.  Pt speaks no english.  Son in car waiting.

## 2018-11-24 NOTE — ED Notes (Signed)
Attempted to call report. Nurse unavailable. Agreed to call back in 10 minutes.

## 2018-11-24 NOTE — ED Notes (Signed)
.. ED TO INPATIENT HANDOFF REPORT  ED Nurse Name and Phone #: Pattricia Boss [phone]  S Name/Age/Gender Rachel [name] 73 y.o. female Room/Bed: ED13A/ED13A  Code Status   Code Status: Prior  Home/SNF/Other Home Patient oriented to: self, place, time and situation Is this baseline? Yes   Triage Complete: Triage complete  Chief Complaint Sent by MD/Blurry Vision  Triage Note Pt ambulatory to triage.  Pt sent from [facility] for eval of stroke.  Pt loss vision 3 times on Saturday   Pt continues to have visual problems today.  No headache.  Pt alert  Speech clear.  Pt speaks no english.  Son in car waiting.     Allergies No Known Allergies  Level of Care/Admitting Diagnosis ED Disposition    ED Disposition Condition Comment   Admit  Hospital Area: [facility] [[id number]]  Level of Care: Med-Surg [16]  Covid Evaluation: N/A  Diagnosis: TIA (transient ischemic attack) [628366]  Admitting Physician: Altamese Dilling [phone]  Attending Physician: Altamese Dilling [phone]  Estimated length of stay: past midnight tomorrow  Certification:: I certify this patient will need inpatient services for at least 2 midnights  PT Class (Do Not Modify): Inpatient [101]  PT Acc Code (Do Not Modify): Private [1]       B Medical/Surgery History Past Medical History:  Diagnosis Date  . GERD (gastroesophageal reflux disease)   . Glaucoma   . Hypertension   . Osteoporosis   . Vitamin D deficiency    Past Surgical History:  Procedure Laterality Date  . ABDOMINAL HYSTERECTOMY    . CATARACT EXTRACTION W/ INTRAOCULAR LENS IMPLANT    . PHOTOCOAGULATION WITH LASER Left 08/27/2018   Procedure: TDC TRANSCLERAL DIODE CYCLOPHOTOCOAGULATION;  Surgeon: Lockie Mola, MD;  Location: [facility];  Service: Ophthalmology;  Laterality: Left;     A IV Location/Drains/Wounds Patient Lines/Drains/Airways Status   Active Line/Drains/Airways    Name:    Placement date:   Placement time:   Site:   Days:   Peripheral IV 11/24/18 Right Forearm   11/24/18    1747    Forearm   less than 1   Incision (Closed) 08/27/18 Eye Left   08/27/18    0856     89          Intake/Output Last 24 hours No intake or output data in the 24 hours ending 11/24/18 2044  Labs/Imaging Results for orders placed or performed during the hospital encounter of 11/24/18 (from the past 48 hour(s))  Comprehensive metabolic panel     Status: Abnormal   Collection Time: 11/24/18  4:49 PM  Result Value Ref Range   Sodium 140 135 - 145 mmol/L   Potassium 3.7 3.5 - 5.1 mmol/L   Chloride 114 (H) 98 - 111 mmol/L   CO2 16 (L) 22 - 32 mmol/L   Glucose, Bld 99 70 - 99 mg/dL   BUN 26 (H) 8 - 23 mg/dL   Creatinine, Ser 5.68 0.44 - 1.00 mg/dL   Calcium 8.8 (L) 8.9 - 10.3 mg/dL   Total Protein 7.7 6.5 - 8.1 g/dL   Albumin 4.6 3.5 - 5.0 g/dL   AST 23 15 - 41 U/L   ALT 16 0 - 44 U/L   Alkaline Phosphatase 54 38 - 126 U/L   Total Bilirubin 0.7 0.3 - 1.2 mg/dL   GFR calc non Af Amer 58 (L) >60 mL/min   GFR calc Af Amer >60 >60 mL/min   Anion gap 10  5 - 15    Comment: Performed at [facility], [address] [place], [place] [address]  CBC with Differential     Status: Abnormal   Collection Time: 11/24/18  4:49 PM  Result Value Ref Range   WBC 6.8 4.0 - 10.5 K/uL   RBC 5.12 (H) 3.87 - 5.11 MIL/uL   Hemoglobin 10.9 (L) 12.0 - 15.0 g/dL   HCT 13.0 (L) 86.5 - 78.4 %   MCV 64.1 (L) 80.0 - 100.0 fL   MCH 21.3 (L) 26.0 - 34.0 pg   MCHC 33.2 30.0 - 36.0 g/dL   RDW 69.6 29.5 - 28.4 %   Platelets 174 150 - 400 K/uL   nRBC 0.0 0.0 - 0.2 %   Neutrophils Relative % 73 %   Neutro Abs 5.0 1.7 - 7.7 K/uL   Lymphocytes Relative 19 %   Lymphs Abs 1.3 0.7 - 4.0 K/uL   Monocytes Relative 6 %   Monocytes Absolute 0.4 0.1 - 1.0 K/uL   Eosinophils Relative 1 %   Eosinophils Absolute 0.1 0.0 - 0.5 K/uL   Basophils Relative 1 %   Basophils Absolute 0.0 0.0 - 0.1 K/uL   WBC  Morphology MORPHOLOGY UNREMARKABLE    Smear Review Normal platelet morphology    Immature Granulocytes 0 %   Abs Immature Granulocytes 0.02 0.00 - 0.07 K/uL   Target Cells PRESENT     Comment: Performed at [facility], [address] [place], [place] [address]  Protime-INR     Status: None   Collection Time: 11/24/18  4:49 PM  Result Value Ref Range   Prothrombin Time 13.3 11.4 - 15.2 seconds   INR 1.0 0.8 - 1.2    Comment: (NOTE) INR goal varies based on device and disease states. Performed at [facility], [address] [place], [place] [address]   Urinalysis, Complete w Microscopic     Status: Abnormal   Collection Time: 11/24/18  4:49 PM  Result Value Ref Range   Color, Urine STRAW (A) YELLOW   APPearance CLEAR (A) CLEAR   Specific Gravity, Urine 1.005 1.005 - 1.030   pH 6.0 5.0 - 8.0   Glucose, UA NEGATIVE NEGATIVE mg/dL   Hgb urine dipstick NEGATIVE NEGATIVE   Bilirubin Urine NEGATIVE NEGATIVE   Ketones, ur NEGATIVE NEGATIVE mg/dL   Protein, ur NEGATIVE NEGATIVE mg/dL   Nitrite NEGATIVE NEGATIVE   Leukocytes,Ua NEGATIVE NEGATIVE   WBC, UA 0-5 0 - 5 WBC/hpf   Bacteria, UA NONE SEEN NONE SEEN   Squamous Epithelial / LPF NONE SEEN 0 - 5    Comment: Performed at [facility], [address], [place], [place] [address]  SARS Coronavirus 2 (CEPHEID - Performed in [facility] hospital lab), Hosp Order     Status: None   Collection Time: 11/24/18  4:49 PM  Result Value Ref Range   SARS Coronavirus 2 NEGATIVE NEGATIVE    Comment: (NOTE) If result is NEGATIVE SARS-CoV-2 target nucleic acids are NOT DETECTED. The SARS-CoV-2 RNA is generally detectable in upper and lower  respiratory specimens during the acute phase of infection. The lowest  concentration of SARS-CoV-2 viral copies this assay can detect is 250  copies / mL. A negative result does not preclude SARS-CoV-2 infection  and should not be used as the sole basis for treatment or other   patient management decisions.  A negative result may occur with  improper specimen collection / handling, submission of specimen other  than nasopharyngeal swab, presence of viral mutation(s) within the  areas targeted by this assay, and inadequate number of viral copies  (<250 copies / mL). A negative result must be combined with clinical  observations, patient history, and epidemiological information. If result is POSITIVE SARS-CoV-2 target nucleic acids are DETECTED. The SARS-CoV-2 RNA is generally detectable in upper and lower  respiratory specimens dur ing the acute phase of infection.  Positive  results are indicative of active infection with SARS-CoV-2.  Clinical  correlation with patient history and other diagnostic information is  necessary to determine patient infection status.  Positive results do  not rule out bacterial infection or co-infection with other viruses. If result is PRESUMPTIVE POSTIVE SARS-CoV-2 nucleic acids MAY BE PRESENT.   A presumptive positive result was obtained on the submitted specimen  and confirmed on repeat testing.  While 2019 novel coronavirus  (SARS-CoV-2) nucleic acids may be present in the submitted sample  additional confirmatory testing may be necessary for epidemiological  and / or clinical management purposes  to differentiate between  SARS-CoV-2 and other Sarbecovirus currently known to infect humans.  If clinically indicated additional testing with an alternate test  methodology [phone]([id number]) is advised. The SARS-CoV-2 RNA is generally  detectable in upper and lower respiratory sp ecimens during the acute  phase of infection. The expected result is Negative. Fact Sheet for Patients:  BoilerBrush.com.cyhttps://www.fda.gov/media/136312/download Fact Sheet for Healthcare Providers: https://pope.com/https://www.fda.gov/media/136313/download This test is not yet approved or cleared by the [place] FDA and has been authorized for detection and/or diagnosis of SARS-CoV-2  by FDA under an Emergency Use Authorization (EUA).  This EUA will remain in effect (meaning this test can be used) for the duration of the COVID-19 declaration under Section 564(b)(1) of the Act, 21 U.S.C. section 360bbb-3(b)(1), unless the authorization is terminated or revoked sooner. Performed at [facility], [address] [place], [place] [address]   Troponin I - Once     Status: None   Collection Time: 11/24/18  4:49 PM  Result Value Ref Range   Troponin I <0.03 <0.03 ng/mL    Comment: Performed at [facility], [address] [place], [place] [address]   Ct Head Wo Contrast  Result Date: 11/24/2018 CLINICAL DATA:  Recurrent visual loss. Possible stroke. EXAM: CT HEAD WITHOUT CONTRAST TECHNIQUE: Contiguous axial images were obtained from the base of the skull through the vertex without intravenous contrast. COMPARISON:  None. FINDINGS: Brain: There is no evidence of acute infarct, intracranial hemorrhage, mass, midline shift, or extra-axial fluid collection. Patchy cerebral white matter hypodensities are nonspecific but compatible with moderately age advanced chronic small vessel ischemic disease. The ventricles and sulci are within normal limits for age. Vascular: Calcified atherosclerosis at the skull base. No hyperdense vessel. Skull: No fracture or focal osseous lesion. Sinuses/Orbits: Partially visualized punctate metallic densities anteriorly in both globes, possibly glaucoma treatment devices. Visualized paranasal sinuses and mastoid air cells are clear. Other: None. IMPRESSION: 1. No evidence of acute intracranial abnormality. 2. Moderate chronic small vessel ischemic disease. Electronically Signed   By: Sebastian AcheAllen  Grady M.D.   On: 11/24/2018 17:25    Pending Labs Unresulted Labs (From admission, onward)   None      Vitals/Pain Today's Vitals   11/24/18 1812 11/24/18 1922 11/24/18 1930 11/24/18 2022  BP: (!) 161/85 (!) 179/82 (!) 179/82 (!) 157/107   Pulse: (!) 58 (!) 53 (!) 59 62  Resp: 15 17 17 17   Temp:      TempSrc:  SpO2: 98% 98% 99% 99%  Weight:      Height:      PainSc:        Isolation Precautions No active isolations  Medications Medications - No data to display  Mobility walks Moderate fall risk   Focused Assessments Neuro Assessment Handoff:  Swallow screen pass? Yes          Neuro Assessment:   Neuro Checks:      Last Documented NIHSS Modified Score:   Has TPA been given? No If patient is a Neuro Trauma and patient is going to OR before floor call report to 4N Charge nurse: [phone] or [phone]     R Recommendations: See Admitting Provider Note  Report given to:   Additional Notes:

## 2018-11-25 ENCOUNTER — Inpatient Hospital Stay: Payer: Medicaid Other

## 2018-11-25 ENCOUNTER — Inpatient Hospital Stay (HOSPITAL_BASED_OUTPATIENT_CLINIC_OR_DEPARTMENT_OTHER)
Admit: 2018-11-25 | Discharge: 2018-11-25 | Disposition: A | Discharge status: Home / Self Care | Payer: Medicaid Other | Attending: Nurse Practitioner | Admitting: Nurse Practitioner

## 2018-11-25 DIAGNOSIS — H5461 Unqualified visual loss, right eye, normal vision left eye: Secondary | ICD-10-CM | POA: Diagnosis not present

## 2018-11-25 DIAGNOSIS — I361 Nonrheumatic tricuspid (valve) insufficiency: Secondary | ICD-10-CM

## 2018-11-25 LAB — LIPID PANEL
Cholesterol: 210 mg/dL — ABNORMAL HIGH (ref 0–200)
HDL: 79 mg/dL (ref >40)
LDL Cholesterol: 123 mg/dL — ABNORMAL HIGH (ref 0–99)
Total CHOL/HDL Ratio: 2.7 RATIO
Triglycerides: 40 mg/dL (ref <150)
VLDL: 8 mg/dL (ref 0–40)

## 2018-11-25 LAB — FERRITIN: Ferritin: 335 ng/mL — ABNORMAL HIGH (ref 11–307)

## 2018-11-25 LAB — ECHOCARDIOGRAM COMPLETE
Height: 61 in
Weight: 2240 oz

## 2018-11-25 LAB — IRON AND TIBC
Iron: 87 ug/dL (ref 28–170)
Saturation Ratios: 38 % — ABNORMAL HIGH (ref 10.4–31.8)
TIBC: 229 ug/dL — ABNORMAL LOW (ref 250–450)
UIBC: 143 ug/dL

## 2018-11-25 LAB — VITAMIN B12: Vitamin B-12: 449 pg/mL (ref 180–914)

## 2018-11-25 LAB — FOLATE: Folate: 33 ng/mL (ref >5.9)

## 2018-11-25 MED ORDER — ATENOLOL 25 MG PO TABS: 25.0000 mg | ORAL_TABLET | Freq: Every day | ORAL | 0 refills | Status: DC

## 2018-11-25 MED ORDER — ATORVASTATIN CALCIUM 40 MG PO TABS: 40.0000 mg | ORAL_TABLET | Freq: Every day | ORAL | 0 refills | Status: AC

## 2018-11-25 MED ORDER — ASPIRIN 81 MG PO TBEC: 81.0000 mg | DELAYED_RELEASE_TABLET | Freq: Every day | ORAL | 0 refills | Status: DC

## 2018-11-25 MED ORDER — ASPIRIN EC 81 MG PO TBEC: 81.0000 mg | DELAYED_RELEASE_TABLET | Freq: Every day | ORAL | Status: DC

## 2018-11-25 NOTE — Progress Notes (Signed)
Physical Therapy Evaluation Patient Details Name: Rachel [name] MRN: [id number] DOB: 1946-07-01 Today's Date: 11/25/2018   History of Present Illness  Rachel [name]  is a 73 y.o. female with a known history of glaucoma, hypertension, GERD, and hyperlipidemia.  Patient was seen earlier in the day yesterday by her ophthalmologist who treats glaucoma and bilateral eyes.  She has been experiencing intermittent vision loss over the last 4 days with 3 episodes on Saturday lasting about 1 hour and multiple episodes on 2 days ago lasting about 10 minutes each.  Patient describes the vision loss as dark and blurry in her right eye.  She is legally blind in her left eye.  She has been receiving intravitreal injections in bilateral eyes with her last injection to her right eye having been in March 2020.  She reports noticing tingling in her left upper extremity after the injections and therefore she discontinued the injections in her right eye.  Patient has no prior history of TIA or CVA.  She denies a prior history of MI.  She denies headache or dizziness.  She denies unilateral weakness.  She denies aphasia.  She denies slurred speech or facial drooping.  At time of therapy evaluation she is complaining of numbness in her LUE/LLE. CT and MRI brain was completed with no acute intracranial normality is noted.  She has been admitted to the hospitalist service for further workup to rule out CVA vs TIA. Stroke work up negative.  Clinical Impression  Pt admitted with above diagnosis. Pt currently with functional limitations due to the deficits listed below (see PT Problem List). Pt is independent with bed mobility and transfers. She is able to ambulate extensively in the hallway with therapist. She appears very mildly unsteady however she reports that she is currently at her baseline. She is able to perform horizontal and vertical head turns without LOB. Gait speed change on command is minimal however language barrier is  also challenging. Overall speed is functional for full household mobility. She has some higher level balance deficits in single leg balance but no fall history and pt is at her baseline. She reports history of chronic low back pain which appears related to her LLE complaints. She also reports chronic L shoulder pain which may be implicated in her LUE symptoms. Pt has no acute PT needs at this time. She is safe to return home with support from family when medically appropriate.     Follow Up Recommendations No PT follow up    Equipment Recommendations  None recommended by PT    Recommendations for Other Services       Precautions / Restrictions Precautions Precautions: None Restrictions Weight Bearing Restrictions: No      Mobility  Bed Mobility Overal bed mobility: Independent             General bed mobility comments: Good speed/sequencing. HOB elevated  Transfers Overall transfer level: Independent Equipment used: None             General transfer comment: Pt demonstrates fair speed and good safety. No instability noted during transfers  Ambulation/Gait Ambulation/Gait assistance: Min guard Gait Distance (Feet): 150 Feet Assistive device: None       General Gait Details: Pt is able to ambulate extensively in the hallway. She appears very mildly unsteady however she reports that she is currently at her baseline. She is able to perform horizontal and vertical head turns without LOB. Gait speed change on command is minimal however language barrier is  also challenging. Overall speed is functional for full household mobility.   Stairs            Wheelchair Mobility    Modified Rankin (Stroke Patients Only)       Balance Overall balance assessment: Needs assistance Sitting-balance support: No upper extremity supported Sitting balance-Leahy Scale: Good     Standing balance support: No upper extremity supported Standing balance-Leahy Scale:  Fair Standing balance comment: Negative Romberg. Single leg balance is approximately 3-5 seconds bilaterally. Pt denies any fall history in the last 12 months                             Pertinent Vitals/Pain Pain Assessment: 0-10 Pain Score: 7  Pain Location: L shoulder Pain Descriptors / Indicators: Aching Pain Intervention(s): Monitored during session    Home Living Family/patient expects to be discharged to:: Private residence Living Arrangements: Children;Other (Comment)(Lives with son and daughter-in-law) Available Help at Discharge: Family Type of Home: House Home Access: Stairs to enter Entrance Stairs-Rails: None Entrance Stairs-Number of Steps: 2 Home Layout: Two level;Able to live on main level with bedroom/bathroom Home Equipment: None      Prior Function Level of Independence: Independent         Comments: Pt is independent with bathing/dressing. She does not drive but her family assists with transportation. Family assists with medication management. No falls in the last 12 months. She wears eye glasses however with worsening vision they have not been helpful     Hand Dominance   Dominant Hand: Right    Extremity/Trunk Assessment   Upper Extremity Assessment Upper Extremity Assessment: LUE deficits/detail LUE Deficits / Details: Difficult to fully assess due to L shoulder pain, pt endorses decreased sensation to LUE which has been going on "for a while", coordination intact LUE: Unable to fully assess due to pain LUE Sensation: decreased light touch LUE Coordination: WNL    Lower Extremity Assessment Lower Extremity Assessment: LLE deficits/detail LLE Deficits / Details: Grossly symmetrical bilaterally. Possible slight weakness in LLE but pt has history of low back pain with L radicular symptoms so this is confounding. RLE is grossly 5/5 throughout. Pt endorses decreased sensation to LLE which has been going on "for a while", coordination  intact LLE Sensation: decreased light touch LLE Coordination: WNL    Cervical / Trunk Assessment Cervical / Trunk Assessment: Normal  Communication   Communication: Interpreter utilized;Other (comment)([place]ian interpreter via [employer], [phone])  Cognition Arousal/Alertness: Awake/alert Behavior During Therapy: WFL for tasks assessed/performed Overall Cognitive Status: Within Functional Limits for tasks assessed                                        General Comments      Exercises     Assessment/Plan    PT Assessment Patent does not need any further PT services  PT Problem List         PT Treatment Interventions      PT Goals (Current goals can be found in the Care Plan section)  Acute Rehab PT Goals Patient Stated Goal: vision to get better PT Goal Formulation: All assessment and education complete, DC therapy    Frequency     Barriers to discharge        Co-evaluation  AM-PAC PT "6 Clicks" Mobility  Outcome Measure Help needed turning from your back to your side while in a flat bed without using bedrails?: None Help needed moving from lying on your back to sitting on the side of a flat bed without using bedrails?: None Help needed moving to and from a bed to a chair (including a wheelchair)?: None Help needed standing up from a chair using your arms (e.g., wheelchair or bedside chair)?: None Help needed to walk in hospital room?: None Help needed climbing 3-5 steps with a railing? : None 6 Click Score: 24    End of Session Equipment Utilized During Treatment: Gait belt Activity Tolerance: Patient tolerated treatment well Patient left: in bed;with call bell/phone within reach   PT Visit Diagnosis: Unsteadiness on feet (R26.81)    Time: [id number] PT Time Calculation (min) (ACUTE ONLY): 17 min   Charges:   PT Evaluation $PT Eval Low Complexity: 1 Low         Jason D Huprich PT, DPT, GCS    Huprich,Jason 11/25/2018, 11:27 AM

## 2018-11-25 NOTE — Progress Notes (Signed)
*  PRELIMINARY RESULTS* Echocardiogram 2D Echocardiogram has been performed.  Rachel [name] 11/25/2018, 11:18 AM

## 2018-11-25 NOTE — Discharge Summary (Signed)
[facility] [place] [facility]   PATIENT NAME: Rachel [name]    MR#:  [id number]  DATE OF BIRTH:  [birth year]  DATE OF ADMISSION:  11/24/2018   ADMITTING PHYSICIAN: Altamese Dilling, MD  DATE OF DISCHARGE: 11/25/18  PRIMARY CARE PHYSICIAN: Oswaldo Conroy, MD   ADMISSION DIAGNOSIS:   Vision loss of right eye [H54.61]  DISCHARGE DIAGNOSIS:   Active Problems:   TIA (transient ischemic attack)   SECONDARY DIAGNOSIS:   Past Medical History:  Diagnosis Date   GERD (gastroesophageal reflux disease)    Glaucoma    Hypertension    Osteoporosis    Vitamin D deficiency     HOSPITAL COURSE:   73 y.o. female with a known history of glaucoma, hypertension, GERD, herpetic Uveitis and hyperlipidemia presenting with intermittent right vision loss over the last 3 days with 3 episodes on Saturday lasting about 1 hour and multiple episodes on yesterday lasting about 10 minutes each.Patient was seen yesterday by her ophthalmologist who treats glaucoma and bilateral eyes.   1. Right eye vision loss - Initial concerns for stroke however stroke up negative.  Differential remains possible ophthalmic condition given history of glaucoma and herpetic uveitis following with ophthalmologist. - MRI/MRA brain was negative for acute cranial abnormality or large vessel occlusion - Ultrasound carotid bilateral showed no hemodynamically significant stenosis in either internal carotid artery - Neurology evaluated patient during this admission - Continue medical management with dual therapy Aspirin 81 mg/day - LDL 123, start atorvastatin 40 mg low density lipoprotein (LDL) <70 mg/dl - Follow-up with ophthalmologist on outpatient basis  2.  Glaucoma - Patient follows up outpatient with ophthalmology - Continue Xalatan and Cosopt drops, Acetazolamide  3. Hypertension - Continue home atenolol 25 mg  4. Hyperlipidemia - LDL elevated - Start Atorvastatin 40 mg -  Follow up with pcp  5.  GERD - PPI therapy continued  DISCHARGE CONDITIONS:   Stable CONSULTS OBTAINED:   Treatment Team:  Pauletta Browns, MD  DRUG ALLERGIES:   No Known Allergies DISCHARGE MEDICATIONS:   Allergies as of 11/25/2018   No Known Allergies     Medication List    TAKE these medications   acetaZOLAMIDE 500 MG capsule Commonly known as:  DIAMOX Take 500 mg by mouth 2 (two) times daily.   aspirin 81 MG EC tablet Take 1 tablet (81 mg total) by mouth daily.   atenolol 25 MG tablet Commonly known as:  Tenormin Take 1 tablet (25 mg total) by mouth daily.   atorvastatin 40 MG tablet Commonly known as:  LIPITOR Take 1 tablet (40 mg total) by mouth daily at 6 PM.   brimonidine 0.2 % ophthalmic solution Commonly known as:  ALPHAGAN Place 1 drop into both eyes 2 (two) times daily.   Calcium 600+D 600-400 MG-UNIT tablet Generic drug:  Calcium Carbonate-Vitamin D Take 1 tablet by mouth 2 (two) times daily.   dorzolamide-timolol 22.3-6.8 MG/ML ophthalmic solution Commonly known as:  COSOPT Place 1 drop into both eyes 2 (two) times daily.   latanoprost 0.005 % ophthalmic solution Commonly known as:  XALATAN Place 1 drop into both eyes at bedtime.   pantoprazole 40 MG tablet Commonly known as:  PROTONIX Take 40 mg by mouth daily.        DISCHARGE INSTRUCTIONS:    DIET:   Regular diet and Low fat, Low cholesterol diet  ACTIVITY:   Activity as tolerated  OXYGEN:   Home Oxygen: No.  Oxygen Delivery: room air  DISCHARGE LOCATION:   home   If you experience worsening of your admission symptoms, develop shortness of breath, life threatening emergency, suicidal or homicidal thoughts you must seek medical attention immediately by calling 911 or calling your MD immediately  if symptoms less severe.  You Must read complete instructions/literature along with all the possible adverse reactions/side effects for all the Medicines you take and that  have been prescribed to you. Take any new Medicines after you have completely understood and accpet all the possible adverse reactions/side effects.   Please note  You were cared for by a hospitalist during your hospital stay. If you have any questions about your discharge medications or the care you received while you were in the hospital after you are discharged, you can call the unit and asked to speak with the hospitalist on call if the hospitalist that took care of you is not available. Once you are discharged, your primary care physician will handle any further medical issues. Please note that NO REFILLS for any discharge medications will be authorized once you are discharged, as it is imperative that you return to your primary care physician (or establish a relationship with a primary care physician if you do not have one) for your aftercare needs so that they can reassess your need for medications and monitor your lab values.    On the day of Discharge:  VITAL SIGNS:   Blood pressure 120/68, pulse (!) 49, temperature 97.7 F (36.5 C), temperature source Oral, resp. rate 18, height  (1.549 m), weight 63.5 kg, SpO2 98 %.  PHYSICAL EXAMINATION:    GENERAL:  73 y.o.-year-old patient lying in the bed with no acute distress.  EYES: Pupils equal, round, reactive to light and accommodation. No scleral icterus. Extraocular muscles intact.  HEENT: Head atraumatic, normocephalic. Oropharynx and nasopharynx clear.  NECK:  Supple, no jugular venous distention. No thyroid enlargement, no tenderness.  LUNGS: Normal breath sounds bilaterally, no wheezing, rales,rhonchi or crepitation. No use of accessory muscles of respiration.  CARDIOVASCULAR: S1, S2 normal. No murmurs, rubs, or gallops.  ABDOMEN: Soft, non-tender, non-distended. Bowel sounds present. No organomegaly or mass.  EXTREMITIES: No pedal edema, cyanosis, or clubbing.  NEUROLOGIC: Cranial nerves II through XII are intact. Muscle  strength 5/5 in all extremities. Sensation intact. Gait not checked.  PSYCHIATRIC: The patient is alert and oriented x 3.  SKIN: No obvious rash, lesion, or ulcer.   DATA REVIEW:   CBC Recent Labs  Lab 11/24/18 1649  WBC 6.8  HGB 10.9*  HCT 32.8*  PLT 174    Chemistries  Recent Labs  Lab 11/24/18 1649  NA 140  K 3.7  CL 114*  CO2 16*  GLUCOSE 99  BUN 26*  CREATININE 0.98  CALCIUM 8.8*  AST 23  ALT 16  ALKPHOS 54  BILITOT 0.7     Microbiology Results  Results for orders placed or performed during the hospital encounter of 11/24/18  SARS Coronavirus 2 (CEPHEID - Performed in [facility] hospital lab), Hosp Order     Status: None   Collection Time: 11/24/18  4:49 PM  Result Value Ref Range Status   SARS Coronavirus 2 NEGATIVE NEGATIVE Final    Comment: (NOTE) If result is NEGATIVE SARS-CoV-2 target nucleic acids are NOT DETECTED. The SARS-CoV-2 RNA is generally detectable in upper and lower  respiratory specimens during the acute phase of infection. The lowest  concentration of SARS-CoV-2 viral copies this assay can detect is 250  copies /  mL. A negative result does not preclude SARS-CoV-2 infection  and should not be used as the sole basis for treatment or other  patient management decisions.  A negative result may occur with  improper specimen collection / handling, submission of specimen other  than nasopharyngeal swab, presence of viral mutation(s) within the  areas targeted by this assay, and inadequate number of viral copies  (<250 copies / mL). A negative result must be combined with clinical  observations, patient history, and epidemiological information. If result is POSITIVE SARS-CoV-2 target nucleic acids are DETECTED. The SARS-CoV-2 RNA is generally detectable in upper and lower  respiratory specimens dur ing the acute phase of infection.  Positive  results are indicative of active infection with SARS-CoV-2.  Clinical  correlation with patient  history and other diagnostic information is  necessary to determine patient infection status.  Positive results do  not rule out bacterial infection or co-infection with other viruses. If result is PRESUMPTIVE POSTIVE SARS-CoV-2 nucleic acids MAY BE PRESENT.   A presumptive positive result was obtained on the submitted specimen  and confirmed on repeat testing.  While 2019 novel coronavirus  (SARS-CoV-2) nucleic acids may be present in the submitted sample  additional confirmatory testing may be necessary for epidemiological  and / or clinical management purposes  to differentiate between  SARS-CoV-2 and other Sarbecovirus currently known to infect humans.  If clinically indicated additional testing with an alternate test  methodology [phone]) is advised. The SARS-CoV-2 RNA is generally  detectable in upper and lower respiratory sp ecimens during the acute  phase of infection. The expected result is Negative. Fact Sheet for Patients:  BoilerBrush.com.cy Fact Sheet for Healthcare Providers: https://pope.com/ This test is not yet approved or cleared by the [place] FDA and has been authorized for detection and/or diagnosis of SARS-CoV-2 by FDA under an Emergency Use Authorization (EUA).  This EUA will remain in effect (meaning this test can be used) for the duration of the COVID-19 declaration under Section 564(b)(1) of the Act, 21 U.S.C. section 360bbb-3(b)(1), unless the authorization is terminated or revoked sooner. Performed at [facility], [address], [place], [place] [address]     RADIOLOGY:  Dg Chest 2 View  Result Date: 11/25/2018 CLINICAL DATA:  Shortness of breath. EXAM: CHEST - 2 VIEW COMPARISON:  07/10/2017 FINDINGS: Grossly unchanged enlarged cardiac silhouette. Unchanged mediastinal contours with atherosclerotic plaque within the thoracic aorta. There is mild elevation/eventration of the right  hemidiaphragm, unchanged. No focal airspace opacities. No pleural effusion or pneumothorax. No evidence of edema. No acute osseous abnormalities. IMPRESSION: Cardiomegaly without superimposed acute cardiopulmonary disease. Electronically Signed   By: Simonne Come M.D.   On: 11/25/2018 09:19   Ct Head Wo Contrast  Result Date: 11/24/2018 CLINICAL DATA:  Recurrent visual loss. Possible stroke. EXAM: CT HEAD WITHOUT CONTRAST TECHNIQUE: Contiguous axial images were obtained from the base of the skull through the vertex without intravenous contrast. COMPARISON:  None. FINDINGS: Brain: There is no evidence of acute infarct, intracranial hemorrhage, mass, midline shift, or extra-axial fluid collection. Patchy cerebral white matter hypodensities are nonspecific but compatible with moderately age advanced chronic small vessel ischemic disease. The ventricles and sulci are within normal limits for age. Vascular: Calcified atherosclerosis at the skull base. No hyperdense vessel. Skull: No fracture or focal osseous lesion. Sinuses/Orbits: Partially visualized punctate metallic densities anteriorly in both globes, possibly glaucoma treatment devices. Visualized paranasal sinuses and mastoid air cells are clear. Other: None. IMPRESSION: 1. No evidence of  acute intracranial abnormality. 2. Moderate chronic small vessel ischemic disease. Electronically Signed   By: Sebastian AcheAllen  Grady M.D.   On: 11/24/2018 17:25   Mr Brain Wo Contrast  Result Date: 11/25/2018 CLINICAL DATA:  Intermittent visual loss 2 days ago. EXAM: MRI HEAD WITHOUT CONTRAST MRA HEAD WITHOUT CONTRAST TECHNIQUE: Multiplanar, multiecho pulse sequences of the brain and surrounding structures were obtained without intravenous contrast. Angiographic images of the head were obtained using MRA technique without contrast. COMPARISON:  Head CT 11/24/2018 FINDINGS: MRI HEAD FINDINGS Brain: Diffusion imaging does not show any acute or subacute infarction. The brainstem and  cerebellum are normal. Cerebral hemispheres show moderate to pronounced changes of chronic small vessel disease affecting the deep and subcortical white matter. No cortical or large vessel territory infarction. No mass lesion, hemorrhage, hydrocephalus or extra-axial collection. Vascular: Major vessels at the base of the brain show flow. Skull and upper cervical spine: Negative Sinuses/Orbits: Clear sinuses.  Bilateral glaucoma surgery. Other: None MRA HEAD FINDINGS Both internal carotid arteries are patent through the skull base and siphon regions. The anterior and middle cerebral vessels are patent without proximal stenosis, aneurysm or vascular malformation. Both vertebral arteries are patent to the basilar. No basilar stenosis. Posterior circulation branch vessels appear normal. IMPRESSION: MRI head: No acute finding. Moderate to pronounced chronic small-vessel ischemic changes of the cerebral hemispheric white matter. No large vessel territory infarction. Intracranial MR angiography: Negative evaluation of the large and medium vessels. No stenosis or occlusion. Electronically Signed   By: Paulina FusiMark  Shogry M.D.   On: 11/25/2018 08:11   [place] Carotid Bilateral (at [facility] And [facility] Only)  Result Date: 11/25/2018 CLINICAL DATA:  Fatigue.  History of hypertension. EXAM: BILATERAL CAROTID DUPLEX ULTRASOUND TECHNIQUE: Wallace CullensGray scale imaging, color Doppler and duplex ultrasound were performed of bilateral carotid and vertebral arteries in the neck. COMPARISON:  None. FINDINGS: Criteria: Quantification of carotid stenosis is based on velocity parameters that correlate the residual internal carotid diameter with NASCET-based stenosis levels, using the diameter of the distal internal carotid lumen as the denominator for stenosis measurement. The following velocity measurements were obtained: RIGHT ICA: 115/49 cm/sec CCA: 43/15 cm/sec SYSTOLIC ICA/CCA RATIO:  2.7 ECA: 56 cm/sec LEFT ICA: 108/44 cm/sec CCA: 63/23 cm/sec SYSTOLIC  ICA/CCA RATIO:  1.7 ECA: 58 cm/sec RIGHT CAROTID ARTERY: There is a minimal amount of eccentric echogenic plaque within the right carotid bulb (image 14), not resulting in elevated peak systolic velocities within the interrogated course the right internal carotid artery to suggest a hemodynamically significant stenosis. RIGHT VERTEBRAL ARTERY:  Antegrade flow LEFT CAROTID ARTERY: There is a minimal amount mixed echogenic plaque within the left carotid bulb (image 47), extending to involve the origin and proximal aspects of the left internal carotid artery (image 54), not resulting in elevated peak systolic velocities within the interrogated course the left internal carotid artery to suggest a hemodynamically significant stenosis. LEFT VERTEBRAL ARTERY:  Antegrade flow IMPRESSION: Minimal amount of bilateral atherosclerotic plaque, right subjectively greater than left, not resulting in a hemodynamically significant stenosis either internal carotid artery. Electronically Signed   By: Simonne ComeJohn  Watts M.D.   On: 11/25/2018 08:51   Mr Maxine GlennMra Head/brain ZOWo Cm  Result Date: 11/25/2018 CLINICAL DATA:  Intermittent visual loss 2 days ago. EXAM: MRI HEAD WITHOUT CONTRAST MRA HEAD WITHOUT CONTRAST TECHNIQUE: Multiplanar, multiecho pulse sequences of the brain and surrounding structures were obtained without intravenous contrast. Angiographic images of the head were obtained using MRA technique without contrast. COMPARISON:  Head CT 11/24/2018  FINDINGS: MRI HEAD FINDINGS Brain: Diffusion imaging does not show any acute or subacute infarction. The brainstem and cerebellum are normal. Cerebral hemispheres show moderate to pronounced changes of chronic small vessel disease affecting the deep and subcortical white matter. No cortical or large vessel territory infarction. No mass lesion, hemorrhage, hydrocephalus or extra-axial collection. Vascular: Major vessels at the base of the brain show flow. Skull and upper cervical spine:  Negative Sinuses/Orbits: Clear sinuses.  Bilateral glaucoma surgery. Other: None MRA HEAD FINDINGS Both internal carotid arteries are patent through the skull base and siphon regions. The anterior and middle cerebral vessels are patent without proximal stenosis, aneurysm or vascular malformation. Both vertebral arteries are patent to the basilar. No basilar stenosis. Posterior circulation branch vessels appear normal. IMPRESSION: MRI head: No acute finding. Moderate to pronounced chronic small-vessel ischemic changes of the cerebral hemispheric white matter. No large vessel territory infarction. Intracranial MR angiography: Negative evaluation of the large and medium vessels. No stenosis or occlusion. Electronically Signed   By: Paulina Fusi M.D.   On: 11/25/2018 08:11     Management plans discussed with the patient, family and they are in agreement.  CODE STATUS:     Code Status Orders  (From admission, onward)         Start     Ordered   11/24/18 2230  Full code  Continuous     11/24/18 2239        Code Status History    Date Active Date Inactive Code Status Order ID Comments User Context   07/11/2017 0025 07/11/2017 1902 Full Code [id number]  Oralia Manis, MD Inpatient      TOTAL TIME TAKING CARE OF THIS PATIENT: 39 minutes.   This patient was staffed with Dr. Orpha Bur, [facility] who personally evaluated patient, reviewed documentation and agreed with discharge plan of care as above.  Webb Silversmith, DNP, FNP-BC Hospitalist Nurse Practitioner   11/25/2018 at 1:09 PM  Between 7am to 6pm - Pager - [phone]  After 6pm go to [email or web address] - Social research officer, government  [facility]  Office  [phone]  CC: Primary care physician; Oswaldo Conroy, MD   Note: This dictation was prepared with Dragon dictation along with smaller phrase technology. Any transcriptional errors that result from this process are unintentional.

## 2018-11-25 NOTE — Progress Notes (Signed)
Pt discharged home son picked her up instructions discussed with pt  With audio intrepreter.  Meds/ diet / activity and f/u discussed.  Verbalized understanding.  Sl d/cd.

## 2018-11-25 NOTE — Evaluation (Signed)
Occupational Therapy Evaluation Patient Details Name: Rachel [name] MRN: [id number] DOB: April 14, 1946 Today's Date: 11/25/2018    History of Present Illness Rachel [name]  is a 74 y.o. female with a known history of glaucoma, hypertension, GERD, and hyperlipidemia.  Patient was seen earlier in the day yesterday by her ophthalmologist who treats glaucoma and bilateral eyes.  She has been experiencing intermittent vision loss over the last 4 days with 3 episodes on Saturday lasting about 1 hour and multiple episodes on 2 days ago lasting about 10 minutes each.  Patient describes the vision loss as dark and blurry in her right eye.  She is legally blind in her left eye.  She has been receiving intravitreal injections in bilateral eyes with her last injection to her right eye having been in March 2020.  She reports noticing tingling in her left upper extremity after the injections and therefore she discontinued the injections in her right eye.  Patient has no prior history of TIA or CVA.  She denies a prior history of MI.  She denies headache or dizziness.  She denies unilateral weakness.  She denies aphasia.  She denies slurred speech or facial drooping.  At time of therapy evaluation she is complaining of numbness in her LUE/LLE. CT and MRI brain was completed with no acute intracranial normality is noted.  She has been admitted to the hospitalist service for further workup to rule out CVA vs TIA. Stroke work up negative.   Clinical Impression   Pt seen for OT evaluation this date. [place] interpreter via [employer] (audio) [phone]. Prior to admission, pt reports being independent with ADL and functional mobility. Pt lives with her son and daughter in law in a 2 story home where she lives on the main floor. Pt denies falls and reports that at baseline she has had L shoulder/arm pain and decreased sensation as well as LLE numbness that she has been seeing a doctor for and receives shots for. Pt  reports that she generally feels at baseline except for her R eye vision is now impaired. She reports intermittent R eye blurry vision, floaters, flashes of light, and loss of vision. At the time of evaluation, she is experiencing R eye blurry vision. Pt able to mobilize and perform ADL tasks independently. Pt would benefit from skilled OT services to support pt's safety and continued independence in ADL and IADL tasks given new visual deficits, including home/routines modifications, adaptive equipment, lighting/magnifying options, and compensatory strategies. NP notified of recommendation at end of session.     Follow Up Recommendations  Outpatient OT(outpatient low vision OT [facility]))    Equipment Recommendations  None recommended by OT    Recommendations for Other Services       Precautions / Restrictions Precautions Precautions: None Restrictions Weight Bearing Restrictions: No      Mobility Bed Mobility Overal bed mobility: Independent             General bed mobility comments: Good speed/sequencing. HOB elevated  Transfers Overall transfer level: Independent Equipment used: None                  Balance Overall balance assessment: No apparent balance deficits (not formally assessed)                                         ADL either performed or assessed with clinical judgement  ADL Overall ADL's : At baseline                                       General ADL Comments: near baseline, supervision for functional mobility for ADL for safety 2/2 visual deficits but pt is generally safe     Vision Baseline Vision/History: Wears glasses;Legally blind;Glaucoma(L eye legally blind) Patient Visual Report: Blurring of vision;Other (comment)(R eye vision loss comes/goes, sometimes with "floaters" or "flashes of light") Vision Assessment?: Yes;Vision impaired- to be further tested in functional context Ocular Range of  Motion: Within Functional Limits Alignment/Gaze Preference: Within Defined Limits Tracking/Visual Pursuits: Able to track stimulus in all quads without difficulty Visual Fields: Other (comment)(L eye legally blind, R eye blurry)     Perception     Praxis      Pertinent Vitals/Pain Pain Assessment: 0-10 Pain Score: 7  Pain Location: L shoulder Pain Descriptors / Indicators: Aching Pain Intervention(s): Monitored during session     Hand Dominance Right   Extremity/Trunk Assessment Upper Extremity Assessment Upper Extremity Assessment: LUE deficits/detail(RUE WFL) LUE Deficits / Details: Difficult to fully assess due to L shoulder pain, pt endorses decreased sensation to LUE which has been going on "for a while", coordination intact LUE: Unable to fully assess due to pain LUE Sensation: decreased light touch LUE Coordination: WNL   Lower Extremity Assessment Lower Extremity Assessment: LLE deficits/detail(RLE WFL) LLE Deficits / Details: Grossly 4 to 4+ throughout LLE. RLE is grossly 5/5 throughout. Pt endorses decreased sensation to LLE which has been going on "for a while", coordination intact LLE Sensation: decreased light touch LLE Coordination: WNL   Cervical / Trunk Assessment Cervical / Trunk Assessment: Normal   Communication Communication Communication: Interpreter utilized;Other (comment)([place]ian interpreter via [employer], [phone])   Cognition Arousal/Alertness: Awake/alert Behavior During Therapy: WFL for tasks assessed/performed Overall Cognitive Status: Within Functional Limits for tasks assessed                                     General Comments       Exercises     Shoulder Instructions      Home Living Family/patient expects to be discharged to:: Private residence Living Arrangements: Children;Other (Comment)(Lives with son and daughter-in-law) Available Help at Discharge: Family Type of Home: House Home Access: Stairs  to enter [employer] of Steps: 2 Entrance Stairs-Rails: None Home Layout: Two level;Able to live on main level with bedroom/bathroom     Bathroom Shower/Tub: Tub/shower unit         Home Equipment: None          Prior Functioning/Environment Level of Independence: Independent        Comments: Pt is independent with bathing/dressing. She does not drive but her family assists with transportation. Family assists with medication management. No falls in the last 12 months. She wears eye glasses however with worsening vision they have not been helpful        OT Problem List: Impaired sensation;Impaired vision/perception      OT Treatment/Interventions: Therapeutic activities;Self-care/ADL training;Visual/perceptual remediation/compensation;DME and/or AE instruction;Patient/family education    OT Goals(Current goals can be found in the care plan section) Acute Rehab OT Goals Patient Stated Goal: vision to get better OT Goal Formulation: With patient Time For Goal Achievement: 12/09/18 Potential to Achieve Goals: Good  OT Frequency:  Min 1X/week   Barriers to D/C:            Co-evaluation              AM-PAC OT "6 Clicks" Daily Activity     Outcome Measure Help from another person eating meals?: None Help from another person taking care of personal grooming?: None Help from another person toileting, which includes using toliet, bedpan, or urinal?: None Help from another person bathing (including washing, rinsing, drying)?: None Help from another person to put on and taking off regular upper body clothing?: None Help from another person to put on and taking off regular lower body clothing?: None 6 Click Score: 24   End of Session Equipment Utilized During Treatment: Gait belt  Activity Tolerance: Patient tolerated treatment well Patient left: in bed;with call bell/phone within reach(EOB with PT for further assessment)  OT Visit Diagnosis: Low vision,  both eyes (H54.2)                Time: 5621-3086: 0923-0951 OT Time Calculation (min): 28 min Charges:  OT General Charges $OT Visit: 1 Visit OT Evaluation $OT Eval Low Complexity: 1 Low  Richrd PrimeJamie Stiller, MPH, MS, OTR/L ascom [phone] 11/25/18, 10:20 AM

## 2018-11-25 NOTE — Progress Notes (Signed)
SLP Cancellation Note  Patient Details Name: Rachel [name] MRN: [id number] DOB: Dec 07, 1945   Cancelled treatment:       Reason Eval/Treat Not Completed: SLP screened, no needs identified, will sign off(chart reviewed; consulted NSG then met w/ pt in room). Pt resting in bed but awakened easily to voice but asked if SLP could talk a little louder. She greeted SLP; accurately answered(in [place]) w/ y/n to general questions about her eating/drinking and talking w/ NSG and other staff. She stated she did not want any Lunch because "I am still full".  Per OT report, pt communicated fluently in Laotian(native language) w/ the Interpreter during their session, and she followed all commands appropriately. There were not deficits noted in higher level verbal communication during their session.  NSG reported pt was swallowing pills w/ water w/out deficits, and she ate a good breakfast meal this morning.  ST services will sign off at this time as pt appears at her baseline w/ communication and swallowing but will be available for any further needs while admitted. NSG agreed.    Orinda Kenner, [place], CCC-SLP Watson,Katherine 11/25/2018, 11:56 AM

## 2018-11-25 NOTE — Consult Note (Signed)
Reason for Consult: blurry vision  Referring Physician: Dr. Nancy Marus   CC: Blurry vision   HPI: Rachel [name] is an 73 y.o. female with a known history of glaucoma, hypertension, GERD, and hyperlipidemia.  Patient was seen yesterday by her ophthalmologist who treats glaucoma and bilateral eyes.  She has been experiencing intermittent vision loss over the last 3 days with 3 episodes on Saturday lasting about 1 hour and multiple episodes on yesterday lasting about 10 minutes each.  Patient describes the vision loss as dark and blurry in her right eye.  She is legally blind in her left eye.  She has been receiving intravitreal injections in bilateral eyes with her last injection to her right eye having been in March 2020.  She reports noticing tingling in her left upper extremity after the injections and therefore she discontinued the injections in her right eye.  Now improved but still has blurry vision. No abnormalities on MRI and MRA Past Medical History:  Diagnosis Date  . GERD (gastroesophageal reflux disease)   . Glaucoma   . Hypertension   . Osteoporosis   . Vitamin D deficiency     Past Surgical History:  Procedure Laterality Date  . ABDOMINAL HYSTERECTOMY    . CATARACT EXTRACTION W/ INTRAOCULAR LENS IMPLANT    . PHOTOCOAGULATION WITH LASER Left 08/27/2018   Procedure: TDC TRANSCLERAL DIODE CYCLOPHOTOCOAGULATION;  Surgeon: Lockie Mola, MD;  Location: [facility];  Service: Ophthalmology;  Laterality: Left;    Family History  Family history unknown: Yes    Social History:  reports that she has never smoked. She has never used smokeless tobacco. She reports previous alcohol use. She reports that she does not use drugs.  No Known Allergies  Medications: I have reviewed the patient's current medications.  ROS: History obtained from the patient  General ROS: negative for - chills, fatigue, fever, night sweats, weight gain or weight loss Psychological ROS: negative  for - behavioral disorder, hallucinations, memory difficulties, mood swings or suicidal ideation Ophthalmic ROS: negative for - blurry vision, double vision, eye pain or loss of vision ENT ROS: negative for - epistaxis, nasal discharge, oral lesions, sore throat, tinnitus or vertigo Allergy and Immunology ROS: negative for - hives or itchy/watery eyes Hematological and Lymphatic ROS: negative for - bleeding problems, bruising or swollen lymph nodes Endocrine ROS: negative for - galactorrhea, hair pattern changes, polydipsia/polyuria or temperature intolerance Respiratory ROS: negative for - cough, hemoptysis, shortness of breath or wheezing Cardiovascular ROS: negative for - chest pain, dyspnea on exertion, edema or irregular heartbeat Gastrointestinal ROS: negative for - abdominal pain, diarrhea, hematemesis, nausea/vomiting or stool incontinence Genito-Urinary ROS: negative for - dysuria, hematuria, incontinence or urinary frequency/urgency Musculoskeletal ROS: negative for - joint swelling or muscular weakness Neurological ROS: as noted in HPI Dermatological ROS: negative for rash and skin lesion changes  Physical Examination: Blood pressure (!) 157/92, pulse (!) 54, temperature 98 F (36.7 C), temperature source Oral, resp. rate 18, height  (1.549 m), weight 63.5 kg, SpO2 97 %.  Neurological Examination   Mental Status: Alert, oriented, thought content appropriate.  Speech fluent without evidence of aphasia.  Able to follow 3 step commands without difficulty. Cranial Nerves: ZO:XWRUEAV blind in L eye   III,IV, VI: ptosis not present, extra-ocular motions intact bilaterally V,VII: smile symmetric, facial light touch sensation normal bilaterally VIII: hearing normal bilaterally IX,X: gag reflex present XI: bilateral shoulder shrug XII: midline tongue extension Motor: Right : Upper extremity   5/5  Left:     Upper extremity   5/5  Lower extremity   5/5     Lower extremity    5/5 Tone and bulk:normal tone throughout; no atrophy noted Sensory: Pinprick and light touch intact throughout, bilaterally Deep Tendon Reflexes: 1+ and symmetric throughout Plantars: Right: downgoing   Left: downgoing Cerebellar: Not tested      Laboratory Studies:   Basic Metabolic Panel: Recent Labs  Lab 11/24/18 1649  NA 140  K 3.7  CL 114*  CO2 16*  GLUCOSE 99  BUN 26*  CREATININE 0.98  CALCIUM 8.8*    Liver Function Tests: Recent Labs  Lab 11/24/18 1649  AST 23  ALT 16  ALKPHOS 54  BILITOT 0.7  PROT 7.7  ALBUMIN 4.6   No results for input(s): LIPASE, AMYLASE in the last 168 hours. No results for input(s): AMMONIA in the last 168 hours.  CBC: Recent Labs  Lab 11/24/18 1649  WBC 6.8  NEUTROABS 5.0  HGB 10.9*  HCT 32.8*  MCV 64.1*  PLT 174    Cardiac Enzymes: Recent Labs  Lab 11/24/18 1649  TROPONINI <0.03    BNP: Invalid input(s): POCBNP  CBG: No results for input(s): GLUCAP in the last 168 hours.  Microbiology: Results for orders placed or performed during the hospital encounter of 11/24/18  SARS Coronavirus 2 (CEPHEID - Performed in [facility] hospital lab), Hosp Order     Status: None   Collection Time: 11/24/18  4:49 PM  Result Value Ref Range Status   SARS Coronavirus 2 NEGATIVE NEGATIVE Final    Comment: (NOTE) If result is NEGATIVE SARS-CoV-2 target nucleic acids are NOT DETECTED. The SARS-CoV-2 RNA is generally detectable in upper and lower  respiratory specimens during the acute phase of infection. The lowest  concentration of SARS-CoV-2 viral copies this assay can detect is 250  copies / mL. A negative result does not preclude SARS-CoV-2 infection  and should not be used as the sole basis for treatment or other  patient management decisions.  A negative result may occur with  improper specimen collection / handling, submission of specimen other  than nasopharyngeal swab, presence of viral mutation(s) within the  areas  targeted by this assay, and inadequate number of viral copies  (<250 copies / mL). A negative result must be combined with clinical  observations, patient history, and epidemiological information. If result is POSITIVE SARS-CoV-2 target nucleic acids are DETECTED. The SARS-CoV-2 RNA is generally detectable in upper and lower  respiratory specimens dur ing the acute phase of infection.  Positive  results are indicative of active infection with SARS-CoV-2.  Clinical  correlation with patient history and other diagnostic information is  necessary to determine patient infection status.  Positive results do  not rule out bacterial infection or co-infection with other viruses. If result is PRESUMPTIVE POSTIVE SARS-CoV-2 nucleic acids MAY BE PRESENT.   A presumptive positive result was obtained on the submitted specimen  and confirmed on repeat testing.  While 2019 novel coronavirus  (SARS-CoV-2) nucleic acids may be present in the submitted sample  additional confirmatory testing may be necessary for epidemiological  and / or clinical management purposes  to differentiate between  SARS-CoV-2 and other Sarbecovirus currently known to infect humans.  If clinically indicated additional testing with an alternate test  methodology [phone]([id number]) is advised. The SARS-CoV-2 RNA is generally  detectable in upper and lower respiratory sp ecimens during the acute  phase of infection. The expected result is Negative. Fact  Sheet for Patients:  BoilerBrush.com.cy Fact Sheet for Healthcare Providers: https://pope.com/ This test is not yet approved or cleared by the [place] FDA and has been authorized for detection and/or diagnosis of SARS-CoV-2 by FDA under an Emergency Use Authorization (EUA).  This EUA will remain in effect (meaning this test can be used) for the duration of the COVID-19 declaration under Section 564(b)(1) of the Act, 21 U.S.C. section  360bbb-3(b)(1), unless the authorization is terminated or revoked sooner. Performed at [facility], [address] [place], [place] [address]     Coagulation Studies: Recent Labs    11/24/18 1649  LABPROT 13.3  INR 1.0    Urinalysis:  Recent Labs  Lab 11/24/18 1649  COLORURINE STRAW*  LABSPEC 1.005  PHURINE 6.0  GLUCOSEU NEGATIVE  HGBUR NEGATIVE  BILIRUBINUR NEGATIVE  KETONESUR NEGATIVE  PROTEINUR NEGATIVE  NITRITE NEGATIVE  LEUKOCYTESUR NEGATIVE    Lipid Panel:     Component Value Date/Time   CHOL 210 (H) 11/25/2018 0603   TRIG 40 11/25/2018 0603   HDL 79 11/25/2018 0603   CHOLHDL 2.7 11/25/2018 0603   VLDL 8 11/25/2018 0603   LDLCALC 123 (H) 11/25/2018 0603    HgbA1C: No results found for: HGBA1C  Urine Drug Screen:  No results found for: LABOPIA, COCAINSCRNUR, LABBENZ, AMPHETMU, THCU, LABBARB  Alcohol Level: No results for input(s): ETH in the last 168 hours.  Other results: EKG: normal EKG, normal sinus rhythm, unchanged from previous tracings.  Imaging: Dg Chest 2 View  Result Date: 11/25/2018 CLINICAL DATA:  Shortness of breath. EXAM: CHEST - 2 VIEW COMPARISON:  07/10/2017 FINDINGS: Grossly unchanged enlarged cardiac silhouette. Unchanged mediastinal contours with atherosclerotic plaque within the thoracic aorta. There is mild elevation/eventration of the right hemidiaphragm, unchanged. No focal airspace opacities. No pleural effusion or pneumothorax. No evidence of edema. No acute osseous abnormalities. IMPRESSION: Cardiomegaly without superimposed acute cardiopulmonary disease. Electronically Signed   By: Simonne Come M.D.   On: 11/25/2018 09:19   Ct Head Wo Contrast  Result Date: 11/24/2018 CLINICAL DATA:  Recurrent visual loss. Possible stroke. EXAM: CT HEAD WITHOUT CONTRAST TECHNIQUE: Contiguous axial images were obtained from the base of the skull through the vertex without intravenous contrast. COMPARISON:  None. FINDINGS: Brain: There  is no evidence of acute infarct, intracranial hemorrhage, mass, midline shift, or extra-axial fluid collection. Patchy cerebral white matter hypodensities are nonspecific but compatible with moderately age advanced chronic small vessel ischemic disease. The ventricles and sulci are within normal limits for age. Vascular: Calcified atherosclerosis at the skull base. No hyperdense vessel. Skull: No fracture or focal osseous lesion. Sinuses/Orbits: Partially visualized punctate metallic densities anteriorly in both globes, possibly glaucoma treatment devices. Visualized paranasal sinuses and mastoid air cells are clear. Other: None. IMPRESSION: 1. No evidence of acute intracranial abnormality. 2. Moderate chronic small vessel ischemic disease. Electronically Signed   By: Sebastian Ache M.D.   On: 11/24/2018 17:25   Mr Brain Wo Contrast  Result Date: 11/25/2018 CLINICAL DATA:  Intermittent visual loss 2 days ago. EXAM: MRI HEAD WITHOUT CONTRAST MRA HEAD WITHOUT CONTRAST TECHNIQUE: Multiplanar, multiecho pulse sequences of the brain and surrounding structures were obtained without intravenous contrast. Angiographic images of the head were obtained using MRA technique without contrast. COMPARISON:  Head CT 11/24/2018 FINDINGS: MRI HEAD FINDINGS Brain: Diffusion imaging does not show any acute or subacute infarction. The brainstem and cerebellum are normal. Cerebral hemispheres show moderate to pronounced changes of chronic small vessel disease affecting the deep and  subcortical white matter. No cortical or large vessel territory infarction. No mass lesion, hemorrhage, hydrocephalus or extra-axial collection. Vascular: Major vessels at the base of the brain show flow. Skull and upper cervical spine: Negative Sinuses/Orbits: Clear sinuses.  Bilateral glaucoma surgery. Other: None MRA HEAD FINDINGS Both internal carotid arteries are patent through the skull base and siphon regions. The anterior and middle cerebral vessels  are patent without proximal stenosis, aneurysm or vascular malformation. Both vertebral arteries are patent to the basilar. No basilar stenosis. Posterior circulation branch vessels appear normal. IMPRESSION: MRI head: No acute finding. Moderate to pronounced chronic small-vessel ischemic changes of the cerebral hemispheric white matter. No large vessel territory infarction. Intracranial MR angiography: Negative evaluation of the large and medium vessels. No stenosis or occlusion. Electronically Signed   By: Paulina Fusi M.D.   On: 11/25/2018 08:11   US Carotid Bilateral (at [facility] And [facility] Only)  Result Date: 11/25/2018 CLINICAL DATA:  Fatigue.  History of hypertension. EXAM: BILATERAL CAROTID DUPLEX ULTRASOUND TECHNIQUE: Wallace Cullens scale imaging, color Doppler and duplex ultrasound were performed of bilateral carotid and vertebral arteries in the neck. COMPARISON:  None. FINDINGS: Criteria: Quantification of carotid stenosis is based on velocity parameters that correlate the residual internal carotid diameter with NASCET-based stenosis levels, using the diameter of the distal internal carotid lumen as the denominator for stenosis measurement. The following velocity measurements were obtained: RIGHT ICA: 115/49 cm/sec CCA: 43/15 cm/sec SYSTOLIC ICA/CCA RATIO:  2.7 ECA: 56 cm/sec LEFT ICA: 108/44 cm/sec CCA: 63/23 cm/sec SYSTOLIC ICA/CCA RATIO:  1.7 ECA: 58 cm/sec RIGHT CAROTID ARTERY: There is a minimal amount of eccentric echogenic plaque within the right carotid bulb (image 14), not resulting in elevated peak systolic velocities within the interrogated course the right internal carotid artery to suggest a hemodynamically significant stenosis. RIGHT VERTEBRAL ARTERY:  Antegrade flow LEFT CAROTID ARTERY: There is a minimal amount mixed echogenic plaque within the left carotid bulb (image 47), extending to involve the origin and proximal aspects of the left internal carotid artery (image 54), not resulting in elevated  peak systolic velocities within the interrogated course the left internal carotid artery to suggest a hemodynamically significant stenosis. LEFT VERTEBRAL ARTERY:  Antegrade flow IMPRESSION: Minimal amount of bilateral atherosclerotic plaque, right subjectively greater than left, not resulting in a hemodynamically significant stenosis either internal carotid artery. Electronically Signed   By: Simonne Come M.D.   On: 11/25/2018 08:51   Mr Maxine Glenn Head/brain XB Cm  Result Date: 11/25/2018 CLINICAL DATA:  Intermittent visual loss 2 days ago. EXAM: MRI HEAD WITHOUT CONTRAST MRA HEAD WITHOUT CONTRAST TECHNIQUE: Multiplanar, multiecho pulse sequences of the brain and surrounding structures were obtained without intravenous contrast. Angiographic images of the head were obtained using MRA technique without contrast. COMPARISON:  Head CT 11/24/2018 FINDINGS: MRI HEAD FINDINGS Brain: Diffusion imaging does not show any acute or subacute infarction. The brainstem and cerebellum are normal. Cerebral hemispheres show moderate to pronounced changes of chronic small vessel disease affecting the deep and subcortical white matter. No cortical or large vessel territory infarction. No mass lesion, hemorrhage, hydrocephalus or extra-axial collection. Vascular: Major vessels at the base of the brain show flow. Skull and upper cervical spine: Negative Sinuses/Orbits: Clear sinuses.  Bilateral glaucoma surgery. Other: None MRA HEAD FINDINGS Both internal carotid arteries are patent through the skull base and siphon regions. The anterior and middle cerebral vessels are patent without proximal stenosis, aneurysm or vascular malformation. Both vertebral arteries are patent to the basilar. No basilar  stenosis. Posterior circulation branch vessels appear normal. IMPRESSION: MRI head: No acute finding. Moderate to pronounced chronic small-vessel ischemic changes of the cerebral hemispheric white matter. No large vessel territory infarction.  Intracranial MR angiography: Negative evaluation of the large and medium vessels. No stenosis or occlusion. Electronically Signed   By: Paulina Fusi M.D.   On: 11/25/2018 08:11     Assessment/Plan:  73 y.o. female with a known history of glaucoma, hypertension, GERD, and hyperlipidemia.  Patient was seen yesterday by her ophthalmologist who treats glaucoma and bilateral eyes.  She has been experiencing intermittent vision loss over the last 3 days with 3 episodes on Saturday lasting about 1 hour and multiple episodes on yesterday lasting about 10 minutes each.  Patient describes the vision loss as dark and blurry in her right eye.  She is legally blind in her left eye.  She has been receiving intravitreal injections in bilateral eyes with her last injection to her right eye having been in March 2020.  She reports noticing tingling in her left upper extremity after the injections and therefore she discontinued the injections in her right eye.  Now improved but still has blurry vision. No abnormalities on MRI and MRA  - ASA  on d/c  - d/c planning toady - ophthalmology follow up as out pt.  Pauletta Browns  11/25/2018, 11:56 AM

## 2018-11-25 NOTE — Plan of Care (Signed)
  Problem: Education: Goal: Knowledge of disease or condition will improve Outcome: Progressing Goal: Knowledge of secondary prevention will improve Outcome: Progressing Goal: Knowledge of patient specific risk factors addressed and post discharge goals established will improve Outcome: Progressing Goal: Individualized Educational Video(s) Outcome: Progressing   Problem: Coping: Goal: Will verbalize positive feelings about self Outcome: Progressing Goal: Will identify appropriate support needs Outcome: Progressing   Problem: Health Behavior/Discharge Planning: Goal: Ability to manage health-related needs will improve Outcome: Progressing   Problem: Self-Care: Goal: Ability to participate in self-care as condition permits will improve Outcome: Progressing Goal: Verbalization of feelings and concerns over difficulty with self-care will improve Outcome: Progressing Goal: Ability to communicate needs accurately will improve Outcome: Progressing   Problem: Nutrition: Goal: Risk of aspiration will decrease Outcome: Progressing   Problem: Ischemic Stroke/TIA Tissue Perfusion: Goal: Complications of ischemic stroke/TIA will be minimized Outcome: Progressing   Problem: Education: Goal: Knowledge of General Education information will improve Description Including pain rating scale, medication(s)/side effects and non-pharmacologic comfort measures Outcome: Progressing   Problem: Health Behavior/Discharge Planning: Goal: Ability to manage health-related needs will improve Outcome: Progressing   Problem: Clinical Measurements: Goal: Ability to maintain clinical measurements within normal limits will improve Outcome: Progressing Goal: Will remain free from infection Outcome: Progressing Goal: Diagnostic test results will improve Outcome: Progressing Goal: Respiratory complications will improve Outcome: Progressing Goal: Cardiovascular complication will be avoided Outcome:  Progressing   Problem: Activity: Goal: Risk for activity intolerance will decrease Outcome: Progressing   Problem: Nutrition: Goal: Adequate nutrition will be maintained Outcome: Progressing   Problem: Coping: Goal: Level of anxiety will decrease Outcome: Progressing   Problem: Elimination: Goal: Will not experience complications related to bowel motility Outcome: Progressing Goal: Will not experience complications related to urinary retention Outcome: Progressing   Problem: Pain Managment: Goal: General experience of comfort will improve Outcome: Progressing   Problem: Safety: Goal: Ability to remain free from injury will improve Outcome: Progressing   Problem: Skin Integrity: Goal: Risk for impaired skin integrity will decrease Outcome: Progressing   

## 2018-11-25 NOTE — H&P (Addendum)
[facility] [place] at [facility]   PATIENT NAME: Rachel [name]    MR#:  [id number]  DATE OF BIRTH:  [birth year]  DATE OF ADMISSION:  11/24/2018  PRIMARY CARE PHYSICIAN: Oswaldo Conroy, MD   REQUESTING/REFERRING PHYSICIAN: Dionne Bucy  CHIEF COMPLAINT:   Chief Complaint  Patient presents with   Eye Problem    HISTORY OF PRESENT ILLNESS:  Rachel [name]  is a 73 y.o. female with a known history of glaucoma, hypertension, GERD, and hyperlipidemia.  Patient was seen earlier in the day by her ophthalmologist who treats glaucoma and bilateral eyes.  She has been experiencing intermittent vision loss over the last 3 days with 3 episodes on Saturday lasting about 1 hour and multiple episodes on yesterday lasting about 10 minutes each.  Patient describes the vision loss as dark and blurry in her right eye.  She is legally blind in her left eye.  She has been receiving intravitreal injections in bilateral eyes with her last injection to her right eye having been in March 2020.  She reports noticing tingling in her left upper extremity after the injections and therefore she discontinued the injections in her right eye.  Patient has no prior history of TIA or CVA.  She denies a prior history of MI.  She denies headache or dizziness.  She denies unilateral weakness.  She denies aphasia.  She denies slurred speech or facial drooping.  Denies numbness or tingling.  CT brain was completed with no acute intracranial normality is noted.  She has been admitted to the hospitalist service for further management  PAST MEDICAL HISTORY:   Past Medical History:  Diagnosis Date   GERD (gastroesophageal reflux disease)    Glaucoma    Hypertension    Osteoporosis    Vitamin D deficiency     PAST SURGICAL HISTORY:   Past Surgical History:  Procedure Laterality Date   ABDOMINAL HYSTERECTOMY     CATARACT EXTRACTION W/ INTRAOCULAR LENS IMPLANT     PHOTOCOAGULATION  WITH LASER Left 08/27/2018   Procedure: [place] TRANSCLERAL DIODE CYCLOPHOTOCOAGULATION;  Surgeon: Lockie Mola, MD;  Location: [facility];  Service: Ophthalmology;  Laterality: Left;    SOCIAL HISTORY:   Social History   Tobacco Use   Smoking status: Never Smoker   Smokeless tobacco: Never Used  Substance Use Topics   Alcohol use: Not Currently    Alcohol/week: 0.0 standard drinks    FAMILY HISTORY:   Family History  Family history unknown: Yes    DRUG ALLERGIES:  No Known Allergies  REVIEW OF SYSTEMS:   Review of Systems  Constitutional: Negative for chills, fever and malaise/fatigue.  HENT: Negative for congestion, hearing loss, sinus pain and sore throat.   Eyes: Positive for blurred vision and redness. Negative for double vision, photophobia, pain and discharge.  Respiratory: Negative for cough, shortness of breath and wheezing.   Cardiovascular: Negative for chest pain, palpitations, leg swelling and PND.  Gastrointestinal: Negative for abdominal pain, constipation, heartburn, nausea and vomiting.  Genitourinary: Negative for dysuria, frequency and hematuria.  Musculoskeletal: Negative for falls and myalgias.  Skin: Negative for itching and rash.  Neurological: Negative for dizziness, tingling, focal weakness, loss of consciousness, weakness and headaches.  Psychiatric/Behavioral: Negative.       MEDICATIONS AT HOME:   Prior to Admission medications   Medication Sig Start Date End Date Taking? Authorizing Provider  acetaZOLAMIDE (DIAMOX) 500 MG capsule Take 500 mg by mouth 2 (two) times daily.  Yes [provider]  brimonidine (ALPHAGAN) 0.2 % ophthalmic solution Place 1 drop into both eyes 2 (two) times daily.  09/02/14  Yes [provider]  Calcium Carbonate-Vitamin D (CALCIUM 600+D) 600-400 MG-UNIT tablet Take 1 tablet by mouth 2 (two) times daily.   Yes [provider]  dorzolamide-timolol (COSOPT) 22.3-6.8 MG/ML  ophthalmic solution Place 1 drop into both eyes 2 (two) times daily.  08/20/14  Yes [provider]  latanoprost (XALATAN) 0.005 % ophthalmic solution Place 1 drop into both eyes at bedtime.   Yes [provider]  pantoprazole (PROTONIX) 40 MG tablet Take 40 mg by mouth daily.  09/30/14  Yes [provider]      VITAL SIGNS:  Blood pressure 135/83, pulse (!) 58, temperature 98.1 F (36.7 C), temperature source Oral, resp. rate 19, height 5\' 1"  (1.549 m), weight 63.5 kg, SpO2 97 %.  PHYSICAL EXAMINATION:  Physical Exam Constitutional:      General: She is not in acute distress.    Appearance: Normal appearance. She is not ill-appearing.  HENT:     Head: Normocephalic.     Right Ear: External ear normal.     Left Ear: External ear normal.     Mouth/Throat:     Mouth: Mucous membranes are moist.     Pharynx: Oropharynx is clear.  Eyes:     Extraocular Movements: Extraocular movements intact.     Comments: Red reflex seen on funduscopic exam Dilated pupils bilaterally  Neck:     Musculoskeletal: Neck supple. No muscular tenderness.  Cardiovascular:     Rate and Rhythm: Normal rate and regular rhythm.     Pulses: Normal pulses.     Heart sounds: Normal heart sounds. No murmur. No friction rub. No gallop.   Pulmonary:     Effort: Pulmonary effort is normal.     Breath sounds: Normal breath sounds. No wheezing, rhonchi or rales.  Abdominal:     General: Abdomen is flat. Bowel sounds are normal.     Palpations: Abdomen is soft.     Tenderness: There is no abdominal tenderness. There is no right CVA tenderness or left CVA tenderness.     Hernia: A hernia is present.  Musculoskeletal: Normal range of motion.        General: No swelling or tenderness.     Right lower leg: No edema.     Left lower leg: No edema.  Skin:    General: Skin is warm and dry.     Capillary Refill: Capillary refill takes less than 2 seconds.     Coloration: Skin is not jaundiced.      Findings: No rash.  Neurological:     General: No focal deficit present.     Mental Status: She is alert and oriented to person, place, and time.     Motor: No weakness.  Psychiatric:        Mood and Affect: Mood normal.        Behavior: Behavior normal.       LABORATORY PANEL:   CBC Recent Labs  Lab 11/24/18 1649  WBC 6.8  HGB 10.9*  HCT 32.8*  PLT 174   ------------------------------------------------------------------------------------------------------------------  Chemistries  Recent Labs  Lab 11/24/18 1649  NA 140  K 3.7  CL 114*  CO2 16*  GLUCOSE 99  BUN 26*  CREATININE 0.98  CALCIUM 8.8*  AST 23  ALT 16  ALKPHOS 54  BILITOT 0.7   ------------------------------------------------------------------------------------------------------------------  Cardiac Enzymes Recent  Labs  Lab 11/24/18 1649  TROPONINI <0.03   ------------------------------------------------------------------------------------------------------------------  RADIOLOGY:  Ct Head Wo Contrast  Result Date: 11/24/2018 CLINICAL DATA:  Recurrent visual loss. Possible stroke. EXAM: CT HEAD WITHOUT CONTRAST TECHNIQUE: Contiguous axial images were obtained from the base of the skull through the vertex without intravenous contrast. COMPARISON:  None. FINDINGS: Brain: There is no evidence of acute infarct, intracranial hemorrhage, mass, midline shift, or extra-axial fluid collection. Patchy cerebral white matter hypodensities are nonspecific but compatible with moderately age advanced chronic small vessel ischemic disease. The ventricles and sulci are within normal limits for age. Vascular: Calcified atherosclerosis at the skull base. No hyperdense vessel. Skull: No fracture or focal osseous lesion. Sinuses/Orbits: Partially visualized punctate metallic densities anteriorly in both globes, possibly glaucoma treatment devices. Visualized paranasal sinuses and mastoid air cells are clear. Other:  None. IMPRESSION: 1. No evidence of acute intracranial abnormality. 2. Moderate chronic small vessel ischemic disease. Electronically Signed   By: Sebastian AcheAllen  Grady M.D.   On: 11/24/2018 17:25      IMPRESSION AND PLAN:   1. TIA - CT brain demonstrates no acute intracranial abnormalities - Continue neurological evaluation with every 2 hours neurological checks - MRI/MRA brain, carotid Doppler studies - Neurology consulted for further evaluation and recommendations - Echocardiogram --Aspirin and statin therapy has been initiated -Lipid panel pending  2.  Glaucoma - Patient follows up outpatient with ophthalmology - Continue Xalatan and Cosopt drops.  3. hypertension - Atenolol continued - We will manage persistent hypertension expectantly - Telemetry monitoring  4.  Hyperlipidemia - Statin therapy continue -Lipid panel pending  5.  GERD - PPI therapy continued  All the records are reviewed and case discussed with ED provider. The plan of care was discussed in details with the patient (and family). I answered all questions. The patient agreed to proceed with the above mentioned plan. Further management will depend upon hospital course.   CODE STATUS: Full code  TOTAL TIME TAKING CARE OF THIS PATIENT: 45  minutes.    Milas Kocherngela H Seals CRNP on 11/25/2018 at 7:35 AM  Pager - [phone]  After 6pm go to [email or web address] - Social research officer, governmentpassword [email or web address]  [facility] [place] [facility]  Office  [phone]  CC: Primary care physician; Oswaldo ConroyBender, Abby Daneele, MD   Note: This dictation was prepared with Dragon dictation along with smaller phrase technology. Any transcriptional errors that result from this process are unintentional.  I hae examined the pt and agree with the plan discussed with NP. -Dr.Chayton Murata

## 2018-11-26 LAB — HEMOGLOBIN A1C
Hgb A1c MFr Bld: 5.1 % (ref 4.8–5.6)
Mean Plasma Glucose: 100 mg/dL

## 2018-11-26 LAB — HIV ANTIBODY (ROUTINE TESTING W REFLEX): HIV Screen 4th Generation wRfx: NONREACTIVE

## 2019-04-24 ENCOUNTER — Observation Stay: Payer: Medicaid Other

## 2019-04-24 ENCOUNTER — Emergency Department: Payer: Medicaid Other

## 2019-04-24 ENCOUNTER — Encounter: Payer: Self-pay | Admitting: Emergency Medicine

## 2019-04-24 ENCOUNTER — Inpatient Hospital Stay
Admission: EM | Admit: 2019-04-24 | Discharge: 2019-04-27 | DRG: 125 | Disposition: A | Discharge status: Home / Self Care | Payer: Medicaid Other | Source: Ambulatory Visit | Attending: Internal Medicine | Admitting: Internal Medicine

## 2019-04-24 ENCOUNTER — Other Ambulatory Visit: Payer: Self-pay

## 2019-04-24 DIAGNOSIS — Z20828 Contact with and (suspected) exposure to other viral communicable diseases: Secondary | ICD-10-CM | POA: Diagnosis present

## 2019-04-24 DIAGNOSIS — E039 Hypothyroidism, unspecified: Secondary | ICD-10-CM | POA: Diagnosis present

## 2019-04-24 DIAGNOSIS — D582 Other hemoglobinopathies: Secondary | ICD-10-CM | POA: Diagnosis present

## 2019-04-24 DIAGNOSIS — K219 Gastro-esophageal reflux disease without esophagitis: Secondary | ICD-10-CM | POA: Diagnosis present

## 2019-04-24 DIAGNOSIS — Z7982 Long term (current) use of aspirin: Secondary | ICD-10-CM

## 2019-04-24 DIAGNOSIS — D569 Thalassemia, unspecified: Secondary | ICD-10-CM | POA: Diagnosis present

## 2019-04-24 DIAGNOSIS — E876 Hypokalemia: Secondary | ICD-10-CM | POA: Diagnosis present

## 2019-04-24 DIAGNOSIS — H538 Other visual disturbances: Principal | ICD-10-CM | POA: Diagnosis present

## 2019-04-24 DIAGNOSIS — E785 Hyperlipidemia, unspecified: Secondary | ICD-10-CM | POA: Diagnosis present

## 2019-04-24 DIAGNOSIS — H402220 Chronic angle-closure glaucoma, left eye, stage unspecified: Secondary | ICD-10-CM | POA: Diagnosis present

## 2019-04-24 DIAGNOSIS — Z23 Encounter for immunization: Secondary | ICD-10-CM

## 2019-04-24 DIAGNOSIS — I10 Essential (primary) hypertension: Secondary | ICD-10-CM | POA: Diagnosis present

## 2019-04-24 DIAGNOSIS — M810 Age-related osteoporosis without current pathological fracture: Secondary | ICD-10-CM | POA: Diagnosis present

## 2019-04-24 DIAGNOSIS — H402213 Chronic angle-closure glaucoma, right eye, severe stage: Secondary | ICD-10-CM | POA: Diagnosis present

## 2019-04-24 DIAGNOSIS — Z8673 Personal history of transient ischemic attack (TIA), and cerebral infarction without residual deficits: Secondary | ICD-10-CM

## 2019-04-24 DIAGNOSIS — Z79899 Other long term (current) drug therapy: Secondary | ICD-10-CM

## 2019-04-24 DIAGNOSIS — E559 Vitamin D deficiency, unspecified: Secondary | ICD-10-CM | POA: Diagnosis present

## 2019-04-24 DIAGNOSIS — H548 Legal blindness, as defined in USA: Secondary | ICD-10-CM | POA: Diagnosis present

## 2019-04-24 DIAGNOSIS — B0052 Herpesviral keratitis: Secondary | ICD-10-CM | POA: Diagnosis present

## 2019-04-24 DIAGNOSIS — H532 Diplopia: Secondary | ICD-10-CM | POA: Diagnosis present

## 2019-04-24 DIAGNOSIS — H353290 Exudative age-related macular degeneration, unspecified eye, stage unspecified: Secondary | ICD-10-CM | POA: Diagnosis present

## 2019-04-24 DIAGNOSIS — H539 Unspecified visual disturbance: Secondary | ICD-10-CM

## 2019-04-24 LAB — DIFFERENTIAL
Abs Immature Granulocytes: 0.01 10*3/uL (ref 0.00–0.07)
Basophils Absolute: 0 10*3/uL (ref 0.0–0.1)
Basophils Relative: 1 %
Eosinophils Absolute: 0.1 10*3/uL (ref 0.0–0.5)
Eosinophils Relative: 2 %
Immature Granulocytes: 0 %
Lymphocytes Relative: 20 %
Lymphs Abs: 1.3 10*3/uL (ref 0.7–4.0)
Monocytes Absolute: 0.5 10*3/uL (ref 0.1–1.0)
Monocytes Relative: 8 %
Neutro Abs: 4.4 10*3/uL (ref 1.7–7.7)
Neutrophils Relative %: 69 %
Smear Review: NORMAL

## 2019-04-24 LAB — COMPREHENSIVE METABOLIC PANEL
ALT: 18 U/L (ref 0–44)
AST: 22 U/L (ref 15–41)
Albumin: 4.5 g/dL (ref 3.5–5.0)
Alkaline Phosphatase: 57 U/L (ref 38–126)
Anion gap: 7 (ref 5–15)
BUN: 17 mg/dL (ref 8–23)
CO2: 20 mmol/L — ABNORMAL LOW (ref 22–32)
Calcium: 9.6 mg/dL (ref 8.9–10.3)
Chloride: 114 mmol/L — ABNORMAL HIGH (ref 98–111)
Creatinine, Ser: 0.94 mg/dL (ref 0.44–1.00)
GFR calc Af Amer: >60 mL/min (ref >60)
GFR calc non Af Amer: >60 mL/min (ref >60)
Glucose, Bld: 96 mg/dL (ref 70–99)
Potassium: 3.3 mmol/L — ABNORMAL LOW (ref 3.5–5.1)
Sodium: 141 mmol/L (ref 135–145)
Total Bilirubin: 0.8 mg/dL (ref 0.3–1.2)
Total Protein: 8.5 g/dL — ABNORMAL HIGH (ref 6.5–8.1)

## 2019-04-24 LAB — PROTIME-INR
INR: 1 (ref 0.8–1.2)
Prothrombin Time: 13.4 seconds (ref 11.4–15.2)

## 2019-04-24 LAB — MAGNESIUM: Magnesium: 2.1 mg/dL (ref 1.7–2.4)

## 2019-04-24 LAB — CBC
HCT: 34.4 % — ABNORMAL LOW (ref 36.0–46.0)
HCT: 32.8 % — ABNORMAL LOW (ref 36.0–46.0)
Hemoglobin: 11.3 g/dL — ABNORMAL LOW (ref 12.0–15.0)
Hemoglobin: 10.8 g/dL — ABNORMAL LOW (ref 12.0–15.0)
MCH: 20.7 pg — ABNORMAL LOW (ref 26.0–34.0)
MCH: 20.8 pg — ABNORMAL LOW (ref 26.0–34.0)
MCHC: 32.8 g/dL (ref 30.0–36.0)
MCHC: 32.9 g/dL (ref 30.0–36.0)
MCV: 63 fL — ABNORMAL LOW (ref 80.0–100.0)
MCV: 63.3 fL — ABNORMAL LOW (ref 80.0–100.0)
Platelets: 181 10*3/uL (ref 150–400)
Platelets: 179 10*3/uL (ref 150–400)
RBC: 5.46 MIL/uL — ABNORMAL HIGH (ref 3.87–5.11)
RBC: 5.18 MIL/uL — ABNORMAL HIGH (ref 3.87–5.11)
RDW: 15.6 % — ABNORMAL HIGH (ref 11.5–15.5)
RDW: 15.9 % — ABNORMAL HIGH (ref 11.5–15.5)
WBC: 6.3 10*3/uL (ref 4.0–10.5)
WBC: 7 10*3/uL (ref 4.0–10.5)
nRBC: 0 % (ref 0.0–0.2)
nRBC: 0 % (ref 0.0–0.2)

## 2019-04-24 LAB — APTT: aPTT: 36 seconds (ref 24–36)

## 2019-04-24 LAB — SARS CORONAVIRUS 2 (TAT 6-24 HRS): SARS Coronavirus 2: NEGATIVE

## 2019-04-24 MED ORDER — ACETAMINOPHEN 650 MG RE SUPP: 650.0000 mg | RECTAL | Status: DC | PRN

## 2019-04-24 MED ORDER — ACETAZOLAMIDE ER 500 MG PO CP12
500.0000 mg | ORAL_CAPSULE | Freq: Two times a day (BID) | ORAL | Status: DC
  Administered 2019-04-24 – 2019-04-27 (×7): 500 mg via ORAL
  Filled 2019-04-24 (×8): qty 1

## 2019-04-24 MED ORDER — SODIUM CHLORIDE 0.9 % IV SOLN
INTRAVENOUS | Status: DC
  Administered 2019-04-24 – 2019-04-26 (×4): via INTRAVENOUS

## 2019-04-24 MED ORDER — ATORVASTATIN CALCIUM 20 MG PO TABS
40.0000 mg | ORAL_TABLET | Freq: Every day | ORAL | Status: DC
  Administered 2019-04-24 – 2019-04-26 (×3): 40 mg via ORAL
  Filled 2019-04-24 (×3): qty 2

## 2019-04-24 MED ORDER — ASPIRIN 325 MG PO TABS
325.0000 mg | ORAL_TABLET | Freq: Every day | ORAL | Status: DC
  Administered 2019-04-25 – 2019-04-27 (×3): 325 mg via ORAL
  Filled 2019-04-24 (×3): qty 1

## 2019-04-24 MED ORDER — PANTOPRAZOLE SODIUM 40 MG PO TBEC
40.0000 mg | DELAYED_RELEASE_TABLET | Freq: Every day | ORAL | Status: DC
  Administered 2019-04-24 – 2019-04-27 (×4): 40 mg via ORAL
  Filled 2019-04-24 (×4): qty 1

## 2019-04-24 MED ORDER — SENNOSIDES-DOCUSATE SODIUM 8.6-50 MG PO TABS: 1.0000 | ORAL_TABLET | Freq: Every evening | ORAL | Status: DC | PRN

## 2019-04-24 MED ORDER — ACETAMINOPHEN 160 MG/5ML PO SOLN
650.0000 mg | ORAL | Status: DC | PRN
  Filled 2019-04-24: qty 20.3

## 2019-04-24 MED ORDER — STROKE: EARLY STAGES OF RECOVERY BOOK: Freq: Once | Status: DC

## 2019-04-24 MED ORDER — ACETAMINOPHEN 325 MG PO TABS
650.0000 mg | ORAL_TABLET | ORAL | Status: DC | PRN
  Administered 2019-04-24 – 2019-04-25 (×2): 650 mg via ORAL
  Filled 2019-04-24 (×2): qty 2

## 2019-04-24 MED ORDER — LATANOPROST 0.005 % OP SOLN
1.0000 [drp] | Freq: Every day | OPHTHALMIC | Status: DC
  Administered 2019-04-24 – 2019-04-26 (×3): 1 [drp] via OPHTHALMIC
  Filled 2019-04-24: qty 2.5

## 2019-04-24 MED ORDER — ASPIRIN 300 MG RE SUPP: 300.0000 mg | Freq: Every day | RECTAL | Status: DC

## 2019-04-24 MED ORDER — ENOXAPARIN SODIUM 40 MG/0.4ML ~~LOC~~ SOLN
40.0000 mg | SUBCUTANEOUS | Status: DC
  Administered 2019-04-24 – 2019-04-26 (×3): 40 mg via SUBCUTANEOUS
  Filled 2019-04-24 (×3): qty 0.4

## 2019-04-24 MED ORDER — DORZOLAMIDE HCL-TIMOLOL MAL 2-0.5 % OP SOLN
1.0000 [drp] | Freq: Two times a day (BID) | OPHTHALMIC | Status: DC
  Administered 2019-04-24 – 2019-04-27 (×7): 1 [drp] via OPHTHALMIC
  Filled 2019-04-24: qty 10

## 2019-04-24 MED ORDER — BRIMONIDINE TARTRATE 0.2 % OP SOLN
1.0000 [drp] | Freq: Two times a day (BID) | OPHTHALMIC | Status: DC
  Administered 2019-04-24 – 2019-04-27 (×7): 1 [drp] via OPHTHALMIC
  Filled 2019-04-24: qty 5

## 2019-04-24 MED ORDER — PNEUMOCOCCAL VAC POLYVALENT 25 MCG/0.5ML IJ INJ
0.5000 mL | INJECTION | INTRAMUSCULAR | Status: AC
  Administered 2019-04-25: 12:00:00 0.5 mL via INTRAMUSCULAR
  Filled 2019-04-24: qty 0.5

## 2019-04-24 MED ORDER — POTASSIUM CHLORIDE CRYS ER 20 MEQ PO TBCR
40.0000 meq | EXTENDED_RELEASE_TABLET | Freq: Once | ORAL | Status: AC
  Administered 2019-04-24: 40 meq via ORAL
  Filled 2019-04-24: qty 2

## 2019-04-24 MED ORDER — CALCIUM CARBONATE-VITAMIN D 500-200 MG-UNIT PO TABS
1.0000 | ORAL_TABLET | Freq: Two times a day (BID) | ORAL | Status: DC
  Administered 2019-04-24 – 2019-04-27 (×7): 1 via ORAL
  Filled 2019-04-24 (×6): qty 1

## 2019-04-24 MED ORDER — ASPIRIN 81 MG PO CHEW
324.0000 mg | CHEWABLE_TABLET | Freq: Once | ORAL | Status: AC
  Administered 2019-04-24: 324 mg via ORAL
  Filled 2019-04-24: qty 4

## 2019-04-24 NOTE — ED Triage Notes (Signed)
Patient reports acute onset of decreased vision in right eye starting yesterday. No weakness noted. Family denies any speech or memory changes. Patient was sent by PCP to rule out stroke. Patient with history of blindness in left eye.

## 2019-04-24 NOTE — ED Provider Notes (Signed)
[facility] Emergency Department Provider Note  ____________________________________________   First MD Initiated Contact with Patient 04/24/19 1202     (approximate)  I have reviewed the triage vital signs and the nursing notes.  History  Chief Complaint Loss of Vision    HPI Rachel [name] is a 73 y.o. female who presents for intermittent vision loss of the right eye, episodes have been coming and going over the last 24 hours.  This is happening in episodes, and she describes a near blackout type episode versus extreme blurred vision. No eye pain. No flashes or floaters.  Patient was seen at the ophthalmology [facility]) earlier today, who was concern for amaurosis fugax and was therefore referred to the emergency department for potential stroke work-up.  She has no other associated neurological symptoms, denies any weakness, numbness, tingling, speech changes.  At baseline she is blind in the left eye due to glaucoma.  Ophthalmology states no significant abnormalities from her baseline on slit-lamp exam.  At baseline her right eye visual acuity is typically 20/60.  Today it was 20/100.  She does have a history of similar symptoms in May, and was admitted for stroke work-up then which was largely unremarkable.   Past Medical Hx Past Medical History:  Diagnosis Date  . GERD (gastroesophageal reflux disease)   . Glaucoma   . Hypertension   . Osteoporosis   . Vitamin D deficiency     Problem List Patient Active Problem List   Diagnosis Date Noted  . TIA (transient ischemic attack) 11/24/2018  . Nausea & vomiting 07/10/2017  . GERD (gastroesophageal reflux disease) 07/10/2017  . Gastroenteritis 07/10/2017  . Preop cardiovascular exam 10/07/2014  . Essential hypertension 10/07/2014  . Hyperlipidemia 10/07/2014  . Vision loss of left eye 10/07/2014    Past Surgical Hx Past Surgical History:  Procedure Laterality Date  . ABDOMINAL  HYSTERECTOMY    . CATARACT EXTRACTION W/ INTRAOCULAR LENS IMPLANT    . PHOTOCOAGULATION WITH LASER Left 08/27/2018   Procedure: [place] TRANSCLERAL DIODE CYCLOPHOTOCOAGULATION;  Surgeon: Leandrew Koyanagi, MD;  Location: [place];  Service: Ophthalmology;  Laterality: Left;    Medications Prior to Admission medications   Medication Sig Start Date End Date Taking? Authorizing Provider  acetaZOLAMIDE (DIAMOX) 500 MG capsule Take 500 mg by mouth 2 (two) times daily.    [provider]  aspirin EC 81 MG EC tablet Take 1 tablet (81 mg total) by mouth daily. 11/25/18   Lang Snow, NP  atorvastatin (LIPITOR) 40 MG tablet Take 1 tablet (40 mg total) by mouth daily at 6 PM. 11/25/18   Lang Snow, NP  brimonidine (ALPHAGAN) 0.2 % ophthalmic solution Place 1 drop into both eyes 2 (two) times daily.  09/02/14   [provider]  Calcium Carbonate-Vitamin D (CALCIUM 600+D) 600-400 MG-UNIT tablet Take 1 tablet by mouth 2 (two) times daily.    [provider]  dorzolamide-timolol (COSOPT) 22.3-6.8 MG/ML ophthalmic solution Place 1 drop into both eyes 2 (two) times daily.  08/20/14   [provider]  latanoprost (XALATAN) 0.005 % ophthalmic solution Place 1 drop into both eyes at bedtime.    [provider]  pantoprazole (PROTONIX) 40 MG tablet Take 40 mg by mouth daily.  09/30/14   [provider]    Allergies Patient has no known allergies.  Family Hx Family History  Family history unknown: Yes    Social Hx Social History   Tobacco Use  .  Smoking status: Never Smoker  . Smokeless tobacco: Never Used  Substance Use Topics  . Alcohol use: Not Currently    Alcohol/week: 0.0 standard drinks  . Drug use: No     Review of Systems  Constitutional: Negative for fever, chills. Eyes: + for visual changes. [name]: Negative for sore throat. Cardiovascular: Negative for chest pain. Respiratory: Negative for shortness of  breath. Gastrointestinal: Negative for nausea, vomiting.  Genitourinary: Negative for dysuria. Musculoskeletal: Negative for leg swelling. Skin: Negative for rash. Neurological: Negative for for headaches.   Physical Exam  Vital Signs: ED Triage Vitals  Enc Vitals Group     BP 04/24/19 1120 (!) 180/85     Pulse Rate 04/24/19 1120 60     Resp 04/24/19 1120 18     Temp 04/24/19 1120 97.9 F (36.6 C)     Temp Source 04/24/19 1120 Oral     SpO2 04/24/19 1120 98 %     Weight 04/24/19 1120 110 lb (49.9 kg)     Height 04/24/19 1120 5' (1.524 m)     Head Circumference --      Peak Flow --      Pain Score 04/24/19 1126 5     Pain Loc --      Pain Edu? --      Excl. in GC? --     Constitutional: Alert and oriented.  Head: Normocephalic. Atraumatic. Eyes: Blind at baseline in LEFT eye. RIGHT eye is dilated 2/2 clinic visit. She can distinguish finger counting. EOMI. Nose: No congestion. No rhinorrhea. Mouth/Throat: Mucous membranes are moist.  Neck: No stridor.   Cardiovascular: Normal rate, regular rhythm. No murmurs. Extremities well perfused. Respiratory: Normal respiratory effort.  Lungs CTAB. Gastrointestinal: Soft. Non-tender. Non-distended.  Musculoskeletal: No lower extremity edema. No deformities. Neurologic:  Normal speech and language. UE and LE strength equal and symmetric. SILT.  Face symmetric. Follows commands. Skin: Skin is warm, dry and intact. No rash noted. Psychiatric: Mood and affect are appropriate for situation.  EKG  Personally reviewed.   Rate: 58 Rhythm: sinus Axis: normal Intervals: WNL No acute ischemic changes No STEMI    Radiology  CT: IMPRESSION: No acute intracranial process.   Procedures  Procedure(s) performed (including critical care):  Procedures   Initial Impression / Assessment and Plan / ED Course  73 y.o. female who presents to the ED for intermittent vision loss of the right eye over the last day.  Ddx: TIA, CVA,  CRAO.  Ophthalmology reports no changes from her baseline on slit-lamp exam, and reportedly normal IOP. Therefore unlike to be retinal detachment, vitreous hemorrhage, or glaucoma.   CT scan is negative. Labs w/o actionable derangements. Will discuss with hospitalist for further stroke work-up. Patient and family agreeable with plan.    Final Clinical Impression(s) / ED Diagnosis  Final diagnoses:  Vision changes       Note:  This document was prepared using Dragon voice recognition software and may include unintentional dictation errors.   Miguel Aschoff., MD 04/24/19 [phone]

## 2019-04-24 NOTE — Plan of Care (Signed)
  Problem: Education: Goal: Knowledge of [facility] Education information/materials will improve Outcome: Progressing Goal: Emotional status will improve Outcome: Progressing Goal: Mental status will improve Outcome: Progressing Goal: Verbalization of understanding the information provided will improve Outcome: Progressing   Problem: Activity: Goal: Interest or engagement in activities will improve Outcome: Progressing Goal: Sleeping patterns will improve Outcome: Progressing   Problem: Coping: Goal: Ability to verbalize frustrations and anger appropriately will improve Outcome: Progressing Goal: Ability to demonstrate self-control will improve Outcome: Progressing   Problem: Health Behavior/Discharge Planning: Goal: Identification of resources available to assist in meeting health care needs will improve Outcome: Progressing Goal: Compliance with treatment plan for underlying cause of condition will improve Outcome: Progressing   Problem: Physical Regulation: Goal: Ability to maintain clinical measurements within normal limits will improve Outcome: Progressing   Problem: Safety: Goal: Periods of time without injury will increase Outcome: Progressing   

## 2019-04-24 NOTE — Progress Notes (Signed)
OT Cancellation Note  Patient Details Name: Satara Virella MRN: [id number] DOB: [birth year]   Cancelled Treatment:    Reason Eval/Treat Not Completed: Patient at procedure or test/ unavailable. Order received, chart reviewed. Pt out of room for testing (MRI). Will re-attempt OT evaluation at later date/time as pt is available and medically appropriate.  Jeni Salles, MPH, MS, OTR/L ascom [phone] 04/24/19, 4:15 PM

## 2019-04-24 NOTE — H&P (Signed)
[facility] [place] at [facility]   PATIENT NAME: Rachel [name]    MR#:  [id number]  DATE OF BIRTH:  11-02-1945  DATE OF ADMISSION:  04/24/2019  PRIMARY CARE PHYSICIAN: Oswaldo Conroy, MD   REQUESTING/REFERRING PHYSICIAN: Dr. Colon Branch  CHIEF COMPLAINT:   Chief Complaint  Patient presents with  . Loss of Vision   Intermittent blurry vision and loss of vision of right eye for 2 days. HISTORY OF PRESENT ILLNESS:  Rachel [name]  is a 73 y.o. female with a known history of hypertension, glaucoma, osteoporosis, vitamin D deficiency and GERD.  The patient is sent from ophthalmology clinic to ED due to above chief complaints.  She denies any focal numbness and weakness, no headache, dizziness or incontinence.  She has similar symptoms earlier this year, work-up at that time was unremarkable.  CAT scan of the head is unremarkable at this time.  ED physician request admission to rule out CVA. PAST MEDICAL HISTORY:   Past Medical History:  Diagnosis Date  . GERD (gastroesophageal reflux disease)   . Glaucoma   . Hypertension   . Osteoporosis   . Vitamin D deficiency     PAST SURGICAL HISTORY:   Past Surgical History:  Procedure Laterality Date  . ABDOMINAL HYSTERECTOMY    . CATARACT EXTRACTION W/ INTRAOCULAR LENS IMPLANT    . PHOTOCOAGULATION WITH LASER Left 08/27/2018   Procedure: TDC TRANSCLERAL DIODE CYCLOPHOTOCOAGULATION;  Surgeon: Lockie Mola, MD;  Location: [facility];  Service: Ophthalmology;  Laterality: Left;    SOCIAL HISTORY:   Social History   Tobacco Use  . Smoking status: Never Smoker  . Smokeless tobacco: Never Used  Substance Use Topics  . Alcohol use: Not Currently    Alcohol/week: 0.0 standard drinks    FAMILY HISTORY:   Family History  Family history unknown: Yes    DRUG ALLERGIES:  No Known Allergies  REVIEW OF SYSTEMS:   Review of Systems  Constitutional: Negative for chills, fever and  malaise/fatigue.  HENT: Negative for sore throat.   Eyes: Positive for blurred vision. Negative for double vision.       Blurry vision.  Respiratory: Negative for cough, hemoptysis, shortness of breath, wheezing and stridor.   Cardiovascular: Negative for chest pain, palpitations, orthopnea and leg swelling.  Gastrointestinal: Negative for abdominal pain, blood in stool, diarrhea, melena, nausea and vomiting.  Genitourinary: Negative for dysuria, flank pain and hematuria.  Musculoskeletal: Negative for back pain and joint pain.  Skin: Negative for rash.  Neurological: Negative for dizziness, sensory change, focal weakness, seizures, loss of consciousness, weakness and headaches.  Endo/Heme/Allergies: Negative for polydipsia.  Psychiatric/Behavioral: Negative for depression. The patient is not nervous/anxious.     MEDICATIONS AT HOME:   Prior to Admission medications   Medication Sig Start Date End Date Taking? Authorizing Provider  acetaZOLAMIDE (DIAMOX) 500 MG capsule Take 500 mg by mouth 2 (two) times daily.    [provider]  aspirin EC 81 MG EC tablet Take 1 tablet (81 mg total) by mouth daily. 11/25/18   Jimmye Norman, NP  atorvastatin (LIPITOR) 40 MG tablet Take 1 tablet (40 mg total) by mouth daily at 6 PM. 11/25/18   Jimmye Norman, NP  brimonidine (ALPHAGAN) 0.2 % ophthalmic solution Place 1 drop into both eyes 2 (two) times daily.  09/02/14   [provider]  Calcium Carbonate-Vitamin D (CALCIUM 600+D) 600-400 MG-UNIT tablet Take 1 tablet by mouth 2 (two) times daily.  [provider]  dorzolamide-timolol (COSOPT) 22.3-6.8 MG/ML ophthalmic solution Place 1 drop into both eyes 2 (two) times daily.  08/20/14   [provider]  latanoprost (XALATAN) 0.005 % ophthalmic solution Place 1 drop into both eyes at bedtime.    [provider]  pantoprazole (PROTONIX) 40 MG tablet Take 40 mg by mouth daily.  09/30/14   [provider]      VITAL SIGNS:  Blood pressure (!) 180/85, pulse 60, temperature 97.9 F (36.6 C), temperature source Oral, resp. rate 18, height 5' (1.524 m), weight 49.9 kg, SpO2 98 %.  PHYSICAL EXAMINATION:  Physical Exam  GENERAL:  73 y.o.-year-old patient lying in the bed with no acute distress.  EYES: LEFT eye blindness.  No scleral icterus. Extraocular muscles intact.  HEENT: Head atraumatic, normocephalic. NECK:  Supple, no jugular venous distention. No thyroid enlargement, no tenderness.  LUNGS: Normal breath sounds bilaterally, no wheezing, rales,rhonchi or crepitation. No use of accessory muscles of respiration.  CARDIOVASCULAR: S1, S2 normal. No murmurs, rubs, or gallops.  ABDOMEN: Soft, nontender, nondistended. Bowel sounds present. No organomegaly or mass.  EXTREMITIES: No pedal edema, cyanosis, or clubbing.  NEUROLOGIC: Cranial nerves II through XII are intact. Muscle strength 5/5 in all extremities. Sensation intact. Gait not checked.  PSYCHIATRIC: The patient is alert and oriented x 3.  SKIN: No obvious rash, lesion, or ulcer.   LABORATORY PANEL:   CBC Recent Labs  Lab 04/24/19 1130  WBC 6.3  HGB 11.3*  HCT 34.4*  PLT 181   ------------------------------------------------------------------------------------------------------------------  Chemistries  Recent Labs  Lab 04/24/19 1130  NA 141  K 3.3*  CL 114*  CO2 20*  GLUCOSE 96  BUN 17  CREATININE 0.94  CALCIUM 9.6  AST 22  ALT 18  ALKPHOS 57  BILITOT 0.8   ------------------------------------------------------------------------------------------------------------------  Cardiac Enzymes No results for input(s): TROPONINI in the last 168 hours. ------------------------------------------------------------------------------------------------------------------  RADIOLOGY:  Ct Head Wo Contrast  Result Date: 04/24/2019 CLINICAL DATA:  Visual loss, concern for stroke EXAM: CT HEAD WITHOUT  CONTRAST TECHNIQUE: Contiguous axial images were obtained from the base of the skull through the vertex without intravenous contrast. COMPARISON:  MR brain dated 11/25/2018 and CT head dated 11/24/2018 FINDINGS: Brain: No evidence of acute infarction, hemorrhage, hydrocephalus, extra-axial collection or mass lesion/mass effect. There is mild cerebral volume loss with associated ex vacuo dilatation. Vascular: There are vascular calcifications in the carotid siphons. Skull: Normal. Negative for fracture or focal lesion. Sinuses/Orbits: No acute finding. Other: None. IMPRESSION: No acute intracranial process. Electronically Signed   By: Zerita Boers M.D.   On: 04/24/2019 12:21      IMPRESSION AND PLAN:   Right eye blurred vision, rule out acute CVA. The patient will be placed for observation. Continue aspirin and Lipitor, MRI of the brain. Carotid arterial duplex this may reported Minimal amount of bilateral atherosclerotic plaque, right subjectively greater than left, not resulting in a hemodynamically significant stenosis either internal carotid artery.  Echocardiograph at that time was unremarkable.  Hypokalemia.  Potassium supplement. Hypertension.  Continue home hypertension medication. Glaucoma, continue home eyedrops, follow-up with ophthalmologist as outpatient.  All the records are reviewed and case discussed with ED provider. Management plans discussed with the patient, her son and they are in agreement.  CODE STATUS: Full code.  TOTAL TIME TAKING CARE OF THIS PATIENT: 45 minutes.    Demetrios Loll M.D on 04/24/2019 at 1:01 PM  Between 7am to 6pm - Pager - [phone]  After  6pm go to [email or web address] - Social research officer, governmentpassword [email or web address]  [facility] [place]  Office  [phone]  CC: Primary care physician; Oswaldo ConroyBender, Abby Daneele, MD   Note: This dictation was prepared with Dragon dictation along with smaller phrase technology. Any transcriptional errors that result from this  process are unin

## 2019-04-24 NOTE — ED Notes (Signed)
Admitting MD at bedside.

## 2019-04-25 ENCOUNTER — Observation Stay: Payer: Medicaid Other

## 2019-04-25 LAB — BASIC METABOLIC PANEL
Anion gap: 7 (ref 5–15)
BUN: 16 mg/dL (ref 8–23)
CO2: 17 mmol/L — ABNORMAL LOW (ref 22–32)
Calcium: 8.9 mg/dL (ref 8.9–10.3)
Chloride: 117 mmol/L — ABNORMAL HIGH (ref 98–111)
Creatinine, Ser: 1.09 mg/dL — ABNORMAL HIGH (ref 0.44–1.00)
GFR calc Af Amer: 59 mL/min — ABNORMAL LOW (ref >60)
GFR calc non Af Amer: 51 mL/min — ABNORMAL LOW (ref >60)
Glucose, Bld: 84 mg/dL (ref 70–99)
Potassium: 3.4 mmol/L — ABNORMAL LOW (ref 3.5–5.1)
Sodium: 141 mmol/L (ref 135–145)

## 2019-04-25 LAB — LIPID PANEL
Cholesterol: 135 mg/dL (ref 0–200)
HDL: 73 mg/dL (ref >40)
LDL Cholesterol: 53 mg/dL (ref 0–99)
Total CHOL/HDL Ratio: 1.8 RATIO
Triglycerides: 45 mg/dL (ref <150)
VLDL: 9 mg/dL (ref 0–40)

## 2019-04-25 LAB — C-REACTIVE PROTEIN: CRP: <0.8 mg/dL (ref <1.0)

## 2019-04-25 LAB — SEDIMENTATION RATE: Sed Rate: 24 mm/hr (ref 0–30)

## 2019-04-25 MED ORDER — GADOBUTROL 1 MMOL/ML IV SOLN
5.0000 mL | Freq: Once | INTRAVENOUS | Status: AC | PRN
  Administered 2019-04-25: 13:00:00 5 mL via INTRAVENOUS

## 2019-04-25 MED ORDER — GADOBUTROL 1 MMOL/ML IV SOLN: 5.0000 mL | Freq: Once | INTRAVENOUS | Status: DC | PRN

## 2019-04-25 MED ORDER — POTASSIUM CHLORIDE CRYS ER 20 MEQ PO TBCR
40.0000 meq | EXTENDED_RELEASE_TABLET | Freq: Once | ORAL | Status: AC
  Administered 2019-04-25: 16:00:00 40 meq via ORAL
  Filled 2019-04-25: qty 2

## 2019-04-25 NOTE — Progress Notes (Signed)
Physical Therapy Evaluation Patient Details Name: Rachel [name] MRN: [id number] DOB: 1945/09/19 Today's Date: 04/25/2019   History of Present Illness  Rachel [name]  is a 73 y.o. female with a known history of hypertension, glaucoma with L eye blindness, osteoporosis, vitamin D deficiency and GERD. She presents with blurry/loss of vision in R eye, sent by PCP to rule out stroke. Imaging negative for acute findings.  Clinical Impression  Pt is independent with bed mobility and transfers. She demonstrates good speed and sequencing with transfers and is initially mildly unsteady but is able to self-correct without external assistance. Performed sit to stand transfers with patient to/from bed and commode. Pt is able to ambulate partially around RN station with therapy without assistive device. She demonstrates decreased speed likely due to poor vision. She does bump into computer table at one point showing decreased head turns for scanning her environment. No overt LOB during ambulation and no external assistance required by therapy. Her single leg balance is >5s on both sides and she is able to achieve and maintain semi-tandem balance. No further PT needs identified at this time. Order will be completed. Please enter new order if status changes.     Follow Up Recommendations No PT follow up    Equipment Recommendations  None recommended by PT;Other (comment)(May benefit from occasional use of spc due to vision loss)    Recommendations for Other Services       Precautions / Restrictions Precautions Precautions: Fall Restrictions Weight Bearing Restrictions: No      Mobility  Bed Mobility Overal bed mobility: Independent Bed Mobility: Supine to Sit     Supine to sit: Supervision     General bed mobility comments: Pt demonstrates fair speed/sequencing when returning from sitting to supine at end of session  Transfers Overall transfer level: Independent   Transfers: Sit to/from  Stand Sit to Stand: Independent         General transfer comment: Pt demonstrates good speed and sequencing with transfers. She is initially mildly unsteady but is able to self-correct without external assistance. Performed sit to stand transfers with patient to/from bed and commode  Ambulation/Gait Ambulation/Gait assistance: Min guard Gait Distance (Feet): 120 Feet Assistive device: None       General Gait Details: Pt is able to ambulate partially around RN station with therapy. She demonstrates decreased speed likely due to poor vision. She does bump into computer table at one point showing decreased head turns for scanning her environment. No overt LOB during ambulation and CGA maintiained for additional safety.   Stairs            Wheelchair Mobility    Modified Rankin (Stroke Patients Only)       Balance Overall balance assessment: Independent(SLS>5s bilaterally, Able to achieve semitandem and maintain)                                           Pertinent Vitals/Pain Pain Assessment: No/denies pain Pain Score: 0-No pain Pain Location: Pt complianing of L eye pain intermittently over the last 2 days but not currently    Home Living Family/patient expects to be discharged to:: Private residence Living Arrangements: Children;Other (Comment)(son and daughter-in-law) Available Help at Discharge: Family Type of Home: House Home Access: Stairs to enter Entrance Stairs-Rails: None Entrance Stairs-Number of Steps: 2 Home Layout: Two level;Able to live on main level with bedroom/bathroom  Home Equipment: None      Prior Function Level of Independence: Independent         Comments: Pt is independent with bathing/dressing. She does not drive but her family assists with transportation. Family assists with medication management. No falls in the last 12 months.     Hand Dominance   Dominant Hand: Right    Extremity/Trunk Assessment   Upper  Extremity Assessment Upper Extremity Assessment: Defer to OT evaluation LUE Deficits / Details: reports some shoulder pain with mobility, able to complete ROMa nd functional tasks on eval    Lower Extremity Assessment Lower Extremity Assessment: Overall WFL for tasks assessed       Communication   Communication: Prefers language other than [place];Interpreter utilized([employer] Derrel Nip)  Cognition Arousal/Alertness: Awake/alert Behavior During Therapy: WFL for tasks assessed/performed Overall Cognitive Status: Within Functional Limits for tasks assessed                                        General Comments      Exercises     Assessment/Plan    PT Assessment Patent does not need any further PT services  PT Problem List         PT Treatment Interventions      PT Goals (Current goals can be found in the Care Plan section)  Acute Rehab PT Goals Patient Stated Goal: get vision better PT Goal Formulation: All assessment and education complete, DC therapy    Frequency     Barriers to discharge        Co-evaluation               AM-PAC PT "6 Clicks" Mobility  Outcome Measure Help needed turning from your back to your side while in a flat bed without using bedrails?: None Help needed moving from lying on your back to sitting on the side of a flat bed without using bedrails?: None Help needed moving to and from a bed to a chair (including a wheelchair)?: None Help needed standing up from a chair using your arms (e.g., wheelchair or bedside chair)?: None Help needed to walk in hospital room?: A Little Help needed climbing 3-5 steps with a railing? : A Little 6 Click Score: 22    End of Session Equipment Utilized During Treatment: Gait belt Activity Tolerance: Patient tolerated treatment well Patient left: in bed;with call bell/phone within reach   PT Visit Diagnosis: Unsteadiness on feet (R26.81)    Time: 4332-9518 PT Time  Calculation (min) (ACUTE ONLY): 30 min   Charges:   PT Evaluation $PT Eval Low Complexity: 1 Low          Shalea Tomczak D Jerilyn Gillaspie PT, DPT, GCS   Achillies Buehl 04/25/2019, 11:21 AM

## 2019-04-25 NOTE — Evaluation (Signed)
Occupational Therapy Evaluation Patient Details Name: Rachel [name] MRN: [id number] DOB: 08-Nov-1945 Today's Date: 04/25/2019    History of Present Illness Rachel [name]  is a 73 y.o. female with a known history of hypertension, glaucoma with L eye blindness, osteoporosis, vitamin D deficiency and GERD. She presents with blurry/loss of vision in R eye, sent by PCP to rule out stroke. Imaging negative for acute findings.   Clinical Impression   Pt admitted with above diagnoses, presenting with baseline blindness in L eye due to glaucoma and c/o low vision in R eye. PTA pt ind with BADL and IADL assist from son, whom she lives with. At time of eval, pt is supervision- min guard for transfers and functional mobility. This is mostly 2/2 visual deficits and scanning and navigating a new environment. Pt with x1 LOB and able to self correct with min guard assist. Noted visual deficits described below. Recommend pt will need OP OT for compensatory strategies with low vision for safer engagement in BADL/IADL in home environment. Will continue to follow acutely per POC listed below.     Follow Up Recommendations  Outpatient OT;Other (comment)(low vision clinic)    Equipment Recommendations  3 in 1 bedside commode    Recommendations for Other Services       Precautions / Restrictions Precautions Precautions: Fall Restrictions Weight Bearing Restrictions: No      Mobility Bed Mobility Overal bed mobility: Needs Assistance Bed Mobility: Supine to Sit     Supine to sit: Supervision     General bed mobility comments: supervision for safety and IV management  Transfers Overall transfer level: Needs assistance   Transfers: Sit to/from Stand Sit to Stand: Min guard         General transfer comment: min guard for safety, pt with x1 minor LOB completing functional mobility in room    Balance Overall balance assessment: Mild deficits observed, not formally tested                                         ADL either performed or assessed with clinical judgement   ADL Overall ADL's : Needs assistance/impaired Eating/Feeding: Set up;Sitting   Grooming: Min guard;Standing   Upper Body Bathing: Min guard;Sitting   Lower Body Bathing: Min guard;Sit to/from stand;Sitting/lateral leans   Upper Body Dressing : Set up;Sitting   Lower Body Dressing: Set up;Sit to/from stand;Sitting/lateral leans   Toilet Transfer: English as a second language teacher;Ambulation   Toileting- Clothing Manipulation and Hygiene: Set up;Sit to/from stand;Sitting/lateral lean   Tub/ Shower Transfer: Min guard;3 in 1;Shower seat;Ambulation   Functional mobility during ADLs: Min guard;Supervision/safety General ADL Comments: pt ltd by low vision and with x1 minor LOB during session.     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 R eye WFL) Additional Comments: Occasional difficulty with R peripheral. Suspect low acuity in this eye. Pt with blindness in L eye and poor vision in R, poor tracking around room to locate therapist without cueing to move head     Perception     Praxis      Pertinent Vitals/Pain Pain  Assessment: No/denies pain Pain Score: 0-No pain Pain Location: Pt complianing of L eye pain intermittently over the last 2 days but not currently     Hand Dominance Right   Extremity/Trunk Assessment Upper Extremity Assessment Upper Extremity Assessment: Overall WFL for tasks assessed;LUE deficits/detail LUE Deficits / Details: reports some  shoulder pain with mobility, able to complete ROMa nd functional tasks on eval   Lower Extremity Assessment Lower Extremity Assessment: Defer to PT evaluation       Communication Communication Communication: Prefers language other than English   Cognition Arousal/Alertness: Awake/alert Behavior During Therapy: WFL for tasks assessed/performed Overall Cognitive Status: Within Functional Limits for tasks assessed                                     General Comments       Exercises     Shoulder Instructions      Home Living Family/patient expects to be discharged to:: Private residence Living Arrangements: Children;Other (Comment)(son) Available Help at Discharge: Family Type of Home: House Home Access: Stairs to enter [employer] of Steps: 2 Entrance Stairs-Rails: None Home Layout: Two level;Able to live on main level with bedroom/bathroom     Bathroom Shower/Tub: Tub/shower unit         Home Equipment: None          Prior Functioning/Environment Level of Independence: Independent        Comments: Pt is independent with bathing/dressing. She does not drive but her family assists with transportation. Family assists with medication management. No falls in the last 12 months.        OT Problem List: Impaired vision/perception;Impaired balance (sitting and/or standing);Decreased safety awareness      OT Treatment/Interventions: Self-care/ADL training;Visual/perceptual remediation/compensation;Patient/family education;Balance training;DME and/or AE instruction    OT Goals(Current goals can be found in the care plan section) Acute Rehab OT Goals Patient Stated Goal: get vision better OT Goal Formulation: With patient Time For Goal Achievement: 05/09/19 Potential to Achieve Goals: Good  OT Frequency: Min 2X/week   Barriers to D/C:            Co-evaluation              AM-PAC OT "6 Clicks" Daily Activity     Outcome  Measure Help from another person eating meals?: A Little Help from another person taking care of personal grooming?: A Little Help from another person toileting, which includes using toliet, bedpan, or urinal?: A Little Help from another person bathing (including washing, rinsing, drying)?: A Little Help from another person to put on and taking off regular upper body clothing?: A Little Help from another person to put on and taking off regular lower body clothing?: A Little 6 Click Score: 18   End of Session Equipment Utilized During Treatment: Gait belt Nurse Communication: Mobility status  Activity Tolerance: Patient tolerated treatment well Patient left: in chair;with call bell/phone within reach  OT Visit Diagnosis: Low vision, both eyes (H54.2)                Time: 4627-0350 OT Time Calculation (min): 21 min Charges:  OT General Charges $OT Visit: 1 Visit OT Evaluation $OT Eval Low Complexity: 1 Low OT Treatments $Self Care/Home Management : 8-22 mins  Zenovia Jarred, MSOT, OTR/L [place] OT/ Acute Relief OT [facility] [phone]  Zenovia Jarred 04/25/2019, 10:43 AM

## 2019-04-25 NOTE — Consult Note (Addendum)
Reason for Consult: Blurry vision on right eye Referring Physician: Hospitalist  CC: Blurry/dark vision on right eye  HPI: Rachel [name] is an 73 y.o. 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  Patient was sent by her OPH to ER for concern of amaurosis fugax and stroke  Over the last 24 hours, patient has been having episodes of of dark/blurry vision on the right eye with no other associated signs. She had 2-3 episodes yeterday that lasts for 1 to 2mn. She also describes to me that she sees light turning  "purple", and sometimes she sees dark spots. She admits for episodes of double vision. This AM, patient states that her vision has gotten better.  No HA, no nausea, no vting, she does c/o of left eye pain, admits for on off double vision/blurry vision but getting better, no LOC, no weakness, no tingling, no paresthesia, no speech disturbances, no medication on previous day given Patient  consulted with OPH and per notes, there was no significant abnlities Patient had a previous admission on May 2020 for the same complaints of blurry vision. Work up including MRI/MRA/ECHO was without abhlities  Her head ct on 04/2019 was w/o acute abnilities as well as her MRI w/o contrast Lab works wnl   Past Medical History:  Diagnosis Date  . GERD (gastroesophageal reflux disease)   . Glaucoma   . Hypertension   . Osteoporosis   . Vitamin D deficiency     Past Surgical History:  Procedure Laterality Date  . ABDOMINAL HYSTERECTOMY    . CATARACT EXTRACTION W/ INTRAOCULAR LENS IMPLANT    . PHOTOCOAGULATION  WITH LASER Left 08/27/2018   Procedure: TDC TRANSCLERAL DIODE CYCLOPHOTOCOAGULATION;  Surgeon: Lockie Mola, MD;  Location: [facility];  Service: Ophthalmology;  Laterality: Left;    Family History  Family history unknown: Yes    Social History:  reports that she has never smoked. She has never used smokeless tobacco. She reports previous alcohol use. She reports that she does not use drugs.  No Known Allergies  Medications: I have reviewed the patient's current medications.  ROS: As per HPI Physical Examination: Blood pressure 140/66, pulse (!) 47, temperature 98.3 F (36.8 C), temperature source Oral, resp. rate 16, height 5' (1.524 m), weight 49.9 kg, SpO2 97 %.  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 Performed at [facility], [address] [place], [place] [address]   APTT     Status: None   Collection Time: 04/24/19 11:30 AM  Result Value Ref Range   aPTT 36 24 - 36 seconds    Comment: Performed at [facility], [address] [place], [place] [address]  CBC     Status: Abnormal   Collection Time: 04/24/19 11:30 AM   Result Value Ref Range   WBC 6.3 4.0 - 10.5 K/uL   RBC 5.46 (H) 3.87 - 5.11 MIL/uL   Hemoglobin 11.3 (L) 12.0 - 15.0 g/dL   HCT 69.6 (L) 29.5 - 28.4 %   MCV 63.0 (L) 80.0 - 100.0 fL   MCH 20.7 (L) 26.0 - 34.0 pg   MCHC 32.8 30.0 - 36.0 g/dL   RDW 13.2 (H) 44.0 - 10.2 %   Platelets 181 150 - 400 K/uL   nRBC 0.0 0.0 - 0.2 %    Comment: Performed at [facility], [address] [place], [place] [address]  Differential     Status: None   Collection Time: 04/24/19 11:30 AM  Result Value Ref Range   Neutrophils Relative % 69 %   Neutro Abs 4.4 1.7 - 7.7 K/uL   Lymphocytes Relative 20 %   Lymphs Abs 1.3 0.7 - 4.0 K/uL   Monocytes Relative 8 %   Monocytes Absolute 0.5 0.1 - 1.0 K/uL   Eosinophils Relative 2 %   Eosinophils Absolute 0.1 0.0 - 0.5 K/uL   Basophils Relative 1 %   Basophils Absolute 0.0 0.0 - 0.1 K/uL   WBC Morphology MORPHOLOGY UNREMARKABLE    Smear Review Normal platelet morphology    Immature Granulocytes 0 %   Abs Immature Granulocytes 0.01 0.00 - 0.07 K/uL   Target Cells PRESENT     Comment: Performed at [facility], [address] [place], [place] [address]  Comprehensive metabolic panel     Status: Abnormal   Collection Time: 04/24/19 11:30 AM  Result Value Ref Range   Sodium 141 135 - 145 mmol/L   Potassium 3.3 (L) 3.5 - 5.1 mmol/L   Chloride 114 (H) 98 - 111 mmol/L   CO2 20 (L) 22 - 32 mmol/L   Glucose, Bld 96 70 - 99 mg/dL   BUN 17 8 - 23 mg/dL   Creatinine, Ser 4.74 0.44 - 1.00 mg/dL   Calcium 9.6 8.9 - 25.9 mg/dL   Total Protein 8.5 (H) 6.5 - 8.1 g/dL   Albumin 4.5 3.5 - 5.0 g/dL   AST 22 15 - 41 U/L   ALT 18 0 - 44 U/L   Alkaline Phosphatase 57 38 - 126 U/L   Total Bilirubin 0.8 0.3 - 1.2 mg/dL   GFR calc non Af Amer >60 >60 mL/min   GFR calc Af Amer >60 >60 mL/min   Anion gap 7 5 - 15    Comment: Performed at [facility], [address] [place], [place] [address]  SARS CORONAVIRUS 2 (TAT 6-24 HRS)  Nasopharyngeal Nasopharyngeal Swab     Status: None   Collection Time: 04/24/19  1:26 PM   Specimen: Nasopharyngeal Swab  Result Value Ref Range   SARS Coronavirus 2 NEGATIVE NEGATIVE    Comment: (NOTE) SARS-CoV-2 target nucleic acids are NOT DETECTED. The SARS-CoV-2 RNA is generally detectable in upper and lower respiratory specimens during the acute phase  of infection. Negative results do not preclude SARS-CoV-2 infection, do not rule out co-infections with other pathogens, and should not be used as the sole basis for treatment or other patient management decisions. Negative results must be combined with clinical observations, patient history, and epidemiological information. The expected result is Negative. Fact Sheet for Patients: HairSlick.no Fact Sheet for Healthcare Providers: quierodirigir.com This test is not yet approved or cleared by the [place] FDA and  has been authorized for detection and/or diagnosis of SARS-CoV-2 by FDA under an Emergency Use Authorization (EUA). This EUA will remain  in effect (meaning this test can be used) for the duration of the COVID-19 declaration under Section 56 4(b)(1) of the Act, 21 U.S.C. section 360bbb-3(b)(1), unless the authorization is terminated or revoked sooner. Performed at [facility], [address], [place], [place] [address]   CBC     Status: Abnormal   Collection Time: 04/24/19  3:15 PM  Result Value Ref Range   WBC 7.0 4.0 - 10.5 K/uL   RBC 5.18 (H) 3.87 - 5.11 MIL/uL   Hemoglobin 10.8 (L) 12.0 - 15.0 g/dL   HCT 82.5 (L) 00.3 - 70.4 %   MCV 63.3 (L) 80.0 - 100.0 fL   MCH 20.8 (L) 26.0 - 34.0 pg   MCHC 32.9 30.0 - 36.0 g/dL   RDW 88.8 (H) 91.6 - 94.5 %   Platelets 179 150 - 400 K/uL   nRBC 0.0 0.0 - 0.2 %    Comment: Performed at [facility], [address], [place], [place] [address]  Magnesium     Status: None   Collection Time: 04/24/19   3:15 PM  Result Value Ref Range   Magnesium 2.1 1.7 - 2.4 mg/dL    Comment: Performed at [facility], [address] [place], [place] [address]  Lipid panel     Status: None   Collection Time: 04/25/19  4:57 AM  Result Value Ref Range   Cholesterol 135 0 - 200 mg/dL   Triglycerides 45 <491 mg/dL   HDL 73 >79 mg/dL   Total CHOL/HDL Ratio 1.8 RATIO   VLDL 9 0 - 40 mg/dL   LDL Cholesterol 53 0 - 99 mg/dL    Comment:        Total Cholesterol/HDL:CHD Risk Coronary Heart Disease Risk Table                     Men   Women  1/2 Average Risk   3.4   3.3  Average Risk       5.0   4.4  2 X Average Risk   9.6   7.1  3 X Average Risk  23.4   11.0        Use the calculated Patient Ratio above and the CHD Risk Table to determine the patient's CHD Risk.        ATP III CLASSIFICATION (LDL):  <100     mg/dL   Optimal  150-569  mg/dL   Near or Above                    Optimal  130-159  mg/dL   Borderline  794-801  mg/dL   High  >655     mg/dL   Very High Performed at [facility] [address], [place], [place] [address]   Basic metabolic panel     Status: Abnormal   Collection Time: 04/25/19  5:00 AM  Result Value Ref Range   Sodium 141 135 - 145 mmol/L   Potassium 3.4 (L) 3.5 - 5.1 mmol/L   Chloride 117 (H) 98 - 111 mmol/L   CO2 17 (L) 22 - 32 mmol/L   Glucose, Bld 84 70 - 99 mg/dL   BUN 16 8 - 23 mg/dL   Creatinine, Ser 7.821.09 (H) 0.44 - 1.00 mg/dL   Calcium 8.9 8.9 - 95.610.3 mg/dL   GFR calc non Af Amer 51 (L) >60 mL/min   GFR calc Af Amer 59 (L) >60 mL/min   Anion gap 7 5 - 15    Comment: Performed at [facility], [address] [place], [place] [address]    Recent Results (from the past 240 hour(s))  SARS CORONAVIRUS 2 (TAT 6-24 HRS) Nasopharyngeal Nasopharyngeal Swab     Status: None   Collection Time: 04/24/19  1:26 PM   Specimen: Nasopharyngeal Swab  Result Value Ref Range Status   SARS Coronavirus 2 NEGATIVE NEGATIVE Final    Comment:  (NOTE) SARS-CoV-2 target nucleic acids are NOT DETECTED. The SARS-CoV-2 RNA is generally detectable in upper and lower respiratory specimens during the acute phase of infection. Negative results do not preclude SARS-CoV-2 infection, do not rule out co-infections with other pathogens, and should not be used as the sole basis for treatment or other patient management decisions. Negative results must be combined with clinical observations, patient history, and epidemiological information. The expected result is Negative. Fact Sheet for Patients: HairSlick.nohttps://www.fda.gov/media/138098/download Fact Sheet for Healthcare Providers: quierodirigir.comhttps://www.fda.gov/media/138095/download This test is not yet approved or cleared by the [place] FDA and  has been authorized for detection and/or diagnosis of SARS-CoV-2 by FDA under an Emergency Use Authorization (EUA). This EUA will remain  in effect (meaning this test can be used) for the duration of the COVID-19 declaration under Section 56 4(b)(1) of the Act, 21 U.S.C. section 360bbb-3(b)(1), unless the authorization is terminated or revoked sooner. Performed at [facility], [address] [place], [place] [address]     Ct Head Wo Contrast  Result Date: 04/24/2019 CLINICAL DATA:  Visual loss, concern for stroke EXAM: CT HEAD WITHOUT CONTRAST TECHNIQUE: Contiguous axial images were obtained from the base of the skull through the vertex without intravenous contrast. COMPARISON:  MR brain dated 11/25/2018 and CT head dated 11/24/2018 FINDINGS: Brain: No evidence of acute infarction, hemorrhage, hydrocephalus, extra-axial collection or mass lesion/mass effect. There is mild cerebral volume loss with associated ex vacuo dilatation. Vascular: There are vascular calcifications in the carotid siphons. Skull: Normal. Negative for fracture or focal lesion. Sinuses/Orbits: No acute finding. Other: None. IMPRESSION: No acute intracranial process. Electronically  Signed   By: Romona Curlsyler  Litton M.D.   On: 04/24/2019 12:21   Mr Brain Wo Contrast  Result Date: 04/24/2019 CLINICAL DATA:  TIA.  Decreased vision right eye EXAM: MRI HEAD WITHOUT CONTRAST TECHNIQUE: Multiplanar, multiecho pulse sequences of the brain and surrounding structures were obtained without intravenous contrast. COMPARISON:  MRI head 11/25/2018 FINDINGS: Brain: Negative for acute infarct. Extensive white matter changes similar to the prior study. Numerous small and moderate size white matter hyperintensities throughout the subcortical and deep white matter bilaterally. Negative for mass lesion. No hemorrhage or fluid collection. Ventricle size normal. Vascular: Normal arterial flow voids Skull and upper cervical spine: Negative Sinuses/Orbits: Negative Other: None IMPRESSION: No acute intracranial abnormality Extensive white matter changes which are chronic and stable compared with Nov 25, 2018 MRI. Findings most compatible with chronic microvascular ischemia.  Electronically Signed   By: Franchot Gallo M.D.   On: 04/24/2019 16:25     Assessment/Plan:  73 y.o. 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 Patient carries diagnosis of thalassemia (notes from 2017 Dr. Lenetta Quaker) and per review of litterrature patients with thalassemia could have ocular involvement and given the recurrence of visual disturbances with a previous negtive w/p  for stroke, I wonder if it is not the case.  RECS: - Neuro protectives measures including normothermia, normoglycemia, correct electrolytes/metabolic abnliies while admitted - Obtain MRI/MRA?MRCV with contrast - Stroke w/up including ECHO - start ASA/statin if not CI  - Consider full OPH consult if indicated ( although she was referred by [facility]) to r/out OPH causes - Consider heme-onc consult if indicated - I discussed the case with Hospitalist - will f/up     04/25/2019, 9:26 AM

## 2019-04-25 NOTE — Consult Note (Signed)
Reason for Consult:intermittent loss of vision OD Referring Physician: Stark Jock, MD  Rachel [name] is an 73 y.o. female.  Chief complaint: intermittent vision loss <principal problem not specified>  HPI: Rachel [name] is a 73 yo asian F patient of mine with extensive past ocular history who presented to [facility] on 04/24/19 with symptoms of intermittent vision loss of right eye since the day before.  This was similar to an episode in May 2020 which required an ED workup as well.  Visual acuity of OD was reduced from baseline 20/50 to 20/200 on 10/9.  Funduscopic exam showed chronic but no acute changes.  No redness, no pain on OD.  No HA's, scalp tenderness, and no weakness, no jaw claudication. She was sent to ED for stroke wku to include carotid dopplers/ MRI.  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 GERD (gastroesophageal reflux disease)   . Glaucoma   . Hypertension   . Osteoporosis   . Vitamin D deficiency     ROS as noted above.  Past Surgical History:  Procedure Laterality Date  . ABDOMINAL HYSTERECTOMY    . CATARACT EXTRACTION W/ INTRAOCULAR LENS IMPLANT    . PHOTOCOAGULATION WITH LASER Left 08/27/2018   Procedure: [place] TRANSCLERAL DIODE CYCLOPHOTOCOAGULATION;  Surgeon: Leandrew Koyanagi, MD;  Location: [place];  Service: Ophthalmology;  Laterality: Left;    Family History  Family history unknown: Yes    Social History:  reports that she has never smoked. She has never used smokeless tobacco. She reports previous alcohol use. She  reports that she does not use drugs.  Allergies: No Known Allergies  Medications:  Prior to Admission:  Medications Prior to Admission  Medication Sig Dispense Refill Last Dose  . acetaZOLAMIDE (DIAMOX) 500 MG capsule Take 500 mg by mouth 2 (two) times daily.   04/24/2019 at Unknown time  . aspirin EC 81 MG EC tablet Take 1 tablet (81 mg total) by mouth daily. 30 tablet 0 04/24/2019 at Unknown time  . atorvastatin (LIPITOR) 40 MG tablet Take 1 tablet (40 mg total) by mouth daily at 6 PM. 30 tablet 0 04/23/2019 at Unknown time  . brimonidine (ALPHAGAN) 0.2 % ophthalmic solution Place 1 drop into both eyes 2 (two) times daily.   0 04/24/2019 at Unknown time  . Calcium Carbonate-Vitamin D (CALCIUM 600+D) 600-400 MG-UNIT tablet Take 1 tablet by mouth 2 (two) times daily.   04/24/2019 at Unknown time  . dorzolamide-timolol (COSOPT) 22.3-6.8 MG/ML ophthalmic solution Place 1 drop into both eyes 2 (two) times daily.   0 04/24/2019 at Unknown time  . latanoprost (XALATAN) 0.005 % ophthalmic solution Place 1 drop into both eyes at bedtime.   04/23/2019 at Unknown time  . pantoprazole (PROTONIX) 40 MG tablet Take 40 mg by mouth daily.   0 04/23/2019 at Unknown time    Results for orders placed or performed during the hospital encounter of 04/24/19 (from the past 48 hour(s))  Protime-INR     Status: None   Collection Time: 04/24/19 11:30 AM  Result Value Ref Range   Prothrombin Time 13.4 11.4 - 15.2 seconds   INR  1.0 0.8 - 1.2    Comment: (NOTE) INR goal varies based on device and disease states. Performed at [facility], [address] [place], [place] [address]   APTT     Status: None   Collection Time: 04/24/19 11:30 AM  Result Value Ref Range   aPTT 36 24 - 36 seconds    Comment: Performed at [facility], [address] [place], [place] [address]  CBC     Status: Abnormal   Collection Time: 04/24/19 11:30 AM  Result Value Ref Range   WBC 6.3 4.0 - 10.5 K/uL   RBC 5.46  (H) 3.87 - 5.11 MIL/uL   Hemoglobin 11.3 (L) 12.0 - 15.0 g/dL   HCT 34.4 (L) 36.0 - 46.0 %   MCV 63.0 (L) 80.0 - 100.0 fL   MCH 20.7 (L) 26.0 - 34.0 pg   MCHC 32.8 30.0 - 36.0 g/dL   RDW 15.6 (H) 11.5 - 15.5 %   Platelets 181 150 - 400 K/uL   nRBC 0.0 0.0 - 0.2 %    Comment: Performed at [facility], [address] [place], [place] [address]  Differential     Status: None   Collection Time: 04/24/19 11:30 AM  Result Value Ref Range   Neutrophils Relative % 69 %   Neutro Abs 4.4 1.7 - 7.7 K/uL   Lymphocytes Relative 20 %   Lymphs Abs 1.3 0.7 - 4.0 K/uL   Monocytes Relative 8 %   Monocytes Absolute 0.5 0.1 - 1.0 K/uL   Eosinophils Relative 2 %   Eosinophils Absolute 0.1 0.0 - 0.5 K/uL   Basophils Relative 1 %   Basophils Absolute 0.0 0.0 - 0.1 K/uL   WBC Morphology MORPHOLOGY UNREMARKABLE    Smear Review Normal platelet morphology    Immature Granulocytes 0 %   Abs Immature Granulocytes 0.01 0.00 - 0.07 K/uL   Target Cells PRESENT     Comment: Performed at [facility], [address] [place], [place] [address]  Comprehensive metabolic panel     Status: Abnormal   Collection Time: 04/24/19 11:30 AM  Result Value Ref Range   Sodium 141 135 - 145 mmol/L   Potassium 3.3 (L) 3.5 - 5.1 mmol/L   Chloride 114 (H) 98 - 111 mmol/L   CO2 20 (L) 22 - 32 mmol/L   Glucose, Bld 96 70 - 99 mg/dL   BUN 17 8 - 23 mg/dL   Creatinine, Ser 0.94 0.44 - 1.00 mg/dL   Calcium 9.6 8.9 - 10.3 mg/dL   Total Protein 8.5 (H) 6.5 - 8.1 g/dL   Albumin 4.5 3.5 - 5.0 g/dL   AST 22 15 - 41 U/L   ALT 18 0 - 44 U/L   Alkaline Phosphatase 57 38 - 126 U/L   Total Bilirubin 0.8 0.3 - 1.2 mg/dL   GFR calc non Af Amer >60 >60 mL/min   GFR calc Af Amer >60 >60 mL/min   Anion gap 7 5 - 15    Comment: Performed at [facility], [address] [place], [place] [address]  SARS CORONAVIRUS 2 (TAT 6-24 HRS) Nasopharyngeal Nasopharyngeal Swab     Status: None   Collection Time:  04/24/19  1:26 PM   Specimen: Nasopharyngeal Swab  Result Value Ref Range   SARS Coronavirus 2 NEGATIVE NEGATIVE    Comment: (NOTE) SARS-CoV-2 target nucleic acids are NOT DETECTED. The SARS-CoV-2 RNA is generally detectable in upper and lower respiratory specimens during  the acute phase of infection. Negative results do not preclude SARS-CoV-2 infection, do not rule out co-infections with other pathogens, and should not be used as the sole basis for treatment or other patient management decisions. Negative results must be combined with clinical observations, patient history, and epidemiological information. The expected result is Negative. Fact Sheet for Patients: SugarRoll.be Fact Sheet for Healthcare Providers: https://www.woods-mathews.com/ This test is not yet approved or cleared by the [place] FDA and  has been authorized for detection and/or diagnosis of SARS-CoV-2 by FDA under an Emergency Use Authorization (EUA). This EUA will remain  in effect (meaning this test can be used) for the duration of the COVID-19 declaration under Section 56 4(b)(1) of the Act, 21 U.S.C. section 360bbb-3(b)(1), unless the authorization is terminated or revoked sooner. Performed at [facility], [place] [address] [place], [place] [address]   CBC     Status: Abnormal   Collection Time: 04/24/19  3:15 PM  Result Value Ref Range   WBC 7.0 4.0 - 10.5 K/uL   RBC 5.18 (H) 3.87 - 5.11 MIL/uL   Hemoglobin 10.8 (L) 12.0 - 15.0 g/dL   HCT 32.8 (L) 36.0 - 46.0 %   MCV 63.3 (L) 80.0 - 100.0 fL   MCH 20.8 (L) 26.0 - 34.0 pg   MCHC 32.9 30.0 - 36.0 g/dL   RDW 15.9 (H) 11.5 - 15.5 %   Platelets 179 150 - 400 K/uL   nRBC 0.0 0.0 - 0.2 %    Comment: Performed at [facility], [address], [place], [place] [address]  Magnesium     Status: None   Collection Time: 04/24/19  3:15 PM  Result Value Ref Range   Magnesium 2.1 1.7 - 2.4 mg/dL     Comment: Performed at [facility], [address] [place], [place] [address]  Lipid panel     Status: None   Collection Time: 04/25/19  4:57 AM  Result Value Ref Range   Cholesterol 135 0 - 200 mg/dL   Triglycerides 45 <150 mg/dL   HDL 73 >40 mg/dL   Total CHOL/HDL Ratio 1.8 RATIO   VLDL 9 0 - 40 mg/dL   LDL Cholesterol 53 0 - 99 mg/dL    Comment:        Total Cholesterol/HDL:CHD Risk Coronary Heart Disease Risk Table                     Men   Women  1/2 Average Risk   3.4   3.3  Average Risk       5.0   4.4  2 X Average Risk   9.6   7.1  3 X Average Risk  23.4   11.0        Use the calculated Patient Ratio above and the CHD Risk Table to determine the patient's CHD Risk.        ATP III CLASSIFICATION (LDL):  <100     mg/dL   Optimal  100-129  mg/dL   Near or Above                    Optimal  130-159  mg/dL   Borderline  160-189  mg/dL   High  >190     mg/dL   Very High Performed at [facility], [address], [place], [place] [address]   Basic metabolic panel     Status: Abnormal   Collection Time: 04/25/19  5:00 AM  Result Value Ref Range   Sodium 141 135 - 145 mmol/L   Potassium 3.4 (L) 3.5 - 5.1 mmol/L   Chloride 117 (H) 98 - 111 mmol/L   CO2 17 (L) 22 - 32 mmol/L   Glucose, Bld 84 70 - 99 mg/dL   BUN 16 8 - 23 mg/dL   Creatinine, Ser 1.09 (H) 0.44 - 1.00 mg/dL   Calcium 8.9 8.9 - 10.3 mg/dL   GFR calc non Af Amer 51 (L) >60 mL/min   GFR calc Af Amer 59 (L) >60 mL/min   Anion gap 7 5 - 15    Comment: Performed at [facility], [address] [place], [place] [address]    Ct Head Wo Contrast  Result Date: 04/24/2019 CLINICAL DATA:  Visual loss, concern for stroke EXAM: CT HEAD WITHOUT CONTRAST TECHNIQUE: Contiguous axial images were obtained from the base of the skull through the vertex without intravenous contrast. COMPARISON:  MR brain dated 11/25/2018 and CT head dated 11/24/2018 FINDINGS: Brain: No evidence of acute  infarction, hemorrhage, hydrocephalus, extra-axial collection or mass lesion/mass effect. There is mild cerebral volume loss with associated ex vacuo dilatation. Vascular: There are vascular calcifications in the carotid siphons. Skull: Normal. Negative for fracture or focal lesion. Sinuses/Orbits: No acute finding. Other: None. IMPRESSION: No acute intracranial process. Electronically Signed   By: Zerita Boers M.D.   On: 04/24/2019 12:21   Mr Angio Head Wo Contrast  Result Date: 04/25/2019 CLINICAL DATA:  Head trauma.  Visual disturbance. EXAM: MRA HEAD WITHOUT CONTRAST TECHNIQUE: Angiographic images of the Circle of Willis were obtained using MRA technique without intravenous contrast. COMPARISON:  MRI yesterday and today. FINDINGS: Both internal carotid arteries are patent through the skull base and siphon regions. There is tortuosity and irregularity of the upper cervical ICA on the left most consistent with fibromuscular disease. No evidence of flow limiting stenosis or pseudo aneurysm. The anterior and middle cerebral vessels are patent without proximal stenosis, aneurysm or vascular malformation. Both vertebral arteries are patent with the right being dominant. No basilar stenosis. Posterior circulation branch vessels are normal. IMPRESSION: No large or medium vessel occlusion or correctable proximal stenosis. Some tortuosity and irregularity with mild ectasia of the distal cervical ICA on the left suggesting underlying fibromuscular disease. No evidence of stenosis or pseudo aneurysm. Electronically Signed   By: Nelson Chimes M.D.   On: 04/25/2019 13:19   Mr Brain Wo Contrast  Result Date: 04/24/2019 CLINICAL DATA:  TIA.  Decreased vision right eye EXAM: MRI HEAD WITHOUT CONTRAST TECHNIQUE: Multiplanar, multiecho pulse sequences of the brain and surrounding structures were obtained without intravenous contrast. COMPARISON:  MRI head 11/25/2018 FINDINGS: Brain: Negative for acute infarct. Extensive  white matter changes similar to the prior study. Numerous small and moderate size white matter hyperintensities throughout the subcortical and deep white matter bilaterally. Negative for mass lesion. No hemorrhage or fluid collection. Ventricle size normal. Vascular: Normal arterial flow voids Skull and upper cervical spine: Negative Sinuses/Orbits: Negative Other: None IMPRESSION: No acute intracranial abnormality Extensive white matter changes which are chronic and stable compared with Nov 25, 2018 MRI. Findings most compatible with chronic microvascular ischemia. Electronically Signed   By: Franchot Gallo M.D.   On: 04/24/2019 16:25   Mr Brain W Contrast  Result Date: 04/25/2019 CLINICAL DATA:  Head trauma. Visual disturbance. Diplopia. Follow-up noncontrast exam yesterday. EXAM: MRI HEAD WITH CONTRAST TECHNIQUE: Multiplanar, multiecho pulse sequences of the brain and surrounding structures were  obtained with intravenous contrast. CONTRAST:  54m GADAVIST GADOBUTROL 1 MMOL/ML IV SOLN COMPARISON:  04/24/2019 FINDINGS: Brain: Moderate chronic small-vessel ischemic changes of the hemispheric deep and subcortical white matter were better demonstrated on the full exam done yesterday. Today's contrast enhanced images do not show any abnormal brain or leptomeningeal enhancement. No sign of vascular occlusion. Vascular: No sign of vascular occlusion. Skull and upper cervical spine: Negative Sinuses/Orbits: Negative Other: None IMPRESSION: No abnormal enhancement of the brain or leptomeninges. No evidence of vascular occlusion. Moderate chronic appearing small vessel changes of the brain demonstrated better on yesterday's full exam. Electronically Signed   By: MNelson ChimesM.D.   On: 04/25/2019 13:17   Mr Mrv Head Wo Cm  Result Date: 04/25/2019 CLINICAL DATA:  Head trauma.  Visual loss.  Diplopia. EXAM: MR VENOGRAM OF THE HEAD WITHOUT CONTRAST TECHNIQUE: Angiographic images of the intracranial venous structures  were obtained using MRV technique without intravenous contrast. COMPARISON:  MRI studies yesterday and today. FINDINGS: Superior sagittal sinus is patent. Deep venous system is patent. Transverse and sigmoid sinuses are patent. Jugular veins show flow. No identifiable superficial thrombosis. IMPRESSION: Negative intracranial MR venography. Electronically Signed   By: MNelson ChimesM.D.   On: 04/25/2019 13:20    Blood pressure 134/79, pulse (!) 52, temperature 98.9 F (37.2 C), temperature source Oral, resp. rate 18, height 5' (1.524 m), weight 49.9 kg, SpO2 100 %.  Mental status: Alert and Oriented x 4  Head: Pulsatile nontender temporal arteries bilat.  Visual Acuity:  20/count fingers OD  20/NLP near [place]  Pupils:  OD round, RL.  OS irreg/ dil  Motility:  Full/ orthophoric  Visual Fields:  constricted to confrontation OD unable OS  IOP:  15 OD w Tonopen  External/ Lids/ Lashes:  Normal  Anterior Segment:  Conjunctiva:  Normal x flat bleb OD      1+ inj OS  Cornea:  Normal  OU  Anterior Chamber: Normal  OU shunt in place OD  Lens:   Normal OU  Posterior Segment: deferred since was examined in clinic 10/9 without any acute findings.   Assessment/Plan: Intermittent Visual loss OD: Concerning for amaurosis- workup w CT/ MRI/ MRA nml Will add ESR and CRP to help r/o GCA because that is certainly in differential in this age bracket. If abnormal - consider TA biopsy.  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

## 2019-04-25 NOTE — Progress Notes (Signed)
[facility] [place] at [facility]   PATIENT NAME: Rachel [name]    MR#:  [id number]  DATE OF BIRTH:  [birth year]  SUBJECTIVE:  CHIEF COMPLAINT:   Chief Complaint  Patient presents with   Loss of Vision   No new complaint this morning.  Seen by neurologist and he recommended getting MRI brain with contrast as well as MRA brain.  He also ordered MRI of the brain.  Physical therapist at bedside working with patient.  REVIEW OF SYSTEMS:  ROS  Constitutional: Negative for chills, fever and malaise/fatigue.  HENT: Negative for sore throat.   Eyes: Positive for blurred vision. Negative for double vision.       Blurry vision.  Respiratory: Negative for cough, hemoptysis, shortness of breath, wheezing and stridor.   Cardiovascular: Negative for chest pain, palpitations, orthopnea and leg swelling.  Gastrointestinal: Negative for abdominal pain, blood in stool, diarrhea, melena, nausea and vomiting.  Genitourinary: Negative for dysuria, flank pain and hematuria.  Musculoskeletal: Negative for back pain and joint pain.  Skin: Negative for rash.  Neurological: Negative for dizziness, sensory change, focal weakness, seizures, loss of consciousness, weakness and headaches.  Endo/Heme/Allergies: Negative for polydipsia.  Psychiatric/Behavioral: Negative for depression. The patient is not nervous/anxious.     DRUG ALLERGIES:  No Known Allergies VITALS:  Blood pressure (!) 149/73, pulse (!) 57, temperature 98.5 F (36.9 C), resp. rate 18, height 5' (1.524 m), weight 49.9 kg, SpO2 97 %. PHYSICAL EXAMINATION:  Physical Exam  GENERAL:  73 y.o.-year-old patient lying in the bed with no acute distress.  EYES: LEFT eye blindness.  No scleral icterus. Extraocular muscles intact.  HEENT: Head atraumatic, normocephalic. NECK:  Supple, no jugular venous distention. No thyroid enlargement, no tenderness.  LUNGS: Normal breath sounds bilaterally, no wheezing, rales,rhonchi or  crepitation. No use of accessory muscles of respiration.  CARDIOVASCULAR: S1, S2 normal. No murmurs, rubs, or gallops.  ABDOMEN: Soft, nontender, nondistended. Bowel sounds present. No organomegaly or mass.  EXTREMITIES: No pedal edema, cyanosis, or clubbing.  NEUROLOGIC: Cranial nerves II through XII are intact. Muscle strength 5/5 in all extremities. Sensation intact. Gait not checked.  PSYCHIATRIC: The patient is alert and oriented x 3.  SKIN: No obvious rash, lesion, or ulcer.   LABORATORY PANEL:  Female CBC Recent Labs  Lab 04/24/19 1515  WBC 7.0  HGB 10.8*  HCT 32.8*  PLT 179   ------------------------------------------------------------------------------------------------------------------ Chemistries  Recent Labs  Lab 04/24/19 1130 04/24/19 1515 04/25/19 0500  NA 141  --  141  K 3.3*  --  3.4*  CL 114*  --  117*  CO2 20*  --  17*  GLUCOSE 96  --  84  BUN 17  --  16  CREATININE 0.94  --  1.09*  CALCIUM 9.6  --  8.9  MG  --  2.1  --   AST 22  --   --   ALT 18  --   --   ALKPHOS 57  --   --   BILITOT 0.8  --   --    RADIOLOGY:  Mr Angio Head Wo Contrast  Result Date: 04/25/2019 CLINICAL DATA:  Head trauma.  Visual disturbance. EXAM: MRA HEAD WITHOUT CONTRAST TECHNIQUE: Angiographic images of the Circle of Willis were obtained using MRA technique without intravenous contrast. COMPARISON:  MRI yesterday and today. FINDINGS: Both internal carotid arteries are patent through the skull base and siphon regions. There is tortuosity and irregularity of the upper cervical  ICA on the left most consistent with fibromuscular disease. No evidence of flow limiting stenosis or pseudo aneurysm. The anterior and middle cerebral vessels are patent without proximal stenosis, aneurysm or vascular malformation. Both vertebral arteries are patent with the right being dominant. No basilar stenosis. Posterior circulation branch vessels are normal. IMPRESSION: No large or medium vessel  occlusion or correctable proximal stenosis. Some tortuosity and irregularity with mild ectasia of the distal cervical ICA on the left suggesting underlying fibromuscular disease. No evidence of stenosis or pseudo aneurysm. Electronically Signed   By: Paulina FusiMark  Shogry M.D.   On: 04/25/2019 13:19   Mr Brain Wo Contrast  Result Date: 04/24/2019 CLINICAL DATA:  TIA.  Decreased vision right eye EXAM: MRI HEAD WITHOUT CONTRAST TECHNIQUE: Multiplanar, multiecho pulse sequences of the brain and surrounding structures were obtained without intravenous contrast. COMPARISON:  MRI head 11/25/2018 FINDINGS: Brain: Negative for acute infarct. Extensive white matter changes similar to the prior study. Numerous small and moderate size white matter hyperintensities throughout the subcortical and deep white matter bilaterally. Negative for mass lesion. No hemorrhage or fluid collection. Ventricle size normal. Vascular: Normal arterial flow voids Skull and upper cervical spine: Negative Sinuses/Orbits: Negative Other: None IMPRESSION: No acute intracranial abnormality Extensive white matter changes which are chronic and stable compared with Nov 25, 2018 MRI. Findings most compatible with chronic microvascular ischemia. Electronically Signed   By: Marlan Palauharles  Clark M.D.   On: 04/24/2019 16:25   Mr Brain W Contrast  Result Date: 04/25/2019 CLINICAL DATA:  Head trauma. Visual disturbance. Diplopia. Follow-up noncontrast exam yesterday. EXAM: MRI HEAD WITH CONTRAST TECHNIQUE: Multiplanar, multiecho pulse sequences of the brain and surrounding structures were obtained with intravenous contrast. CONTRAST:  5mL GADAVIST GADOBUTROL 1 MMOL/ML IV SOLN COMPARISON:  04/24/2019 FINDINGS: Brain: Moderate chronic small-vessel ischemic changes of the hemispheric deep and subcortical white matter were better demonstrated on the full exam done yesterday. Today's contrast enhanced images do not show any abnormal brain or leptomeningeal enhancement. No  sign of vascular occlusion. Vascular: No sign of vascular occlusion. Skull and upper cervical spine: Negative Sinuses/Orbits: Negative Other: None IMPRESSION: No abnormal enhancement of the brain or leptomeninges. No evidence of vascular occlusion. Moderate chronic appearing small vessel changes of the brain demonstrated better on yesterday's full exam. Electronically Signed   By: Paulina FusiMark  Shogry M.D.   On: 04/25/2019 13:17   Mr Mrv Head Wo Cm  Result Date: 04/25/2019 CLINICAL DATA:  Head trauma.  Visual loss.  Diplopia. EXAM: MR VENOGRAM OF THE HEAD WITHOUT CONTRAST TECHNIQUE: Angiographic images of the intracranial venous structures were obtained using MRV technique without intravenous contrast. COMPARISON:  MRI studies yesterday and today. FINDINGS: Superior sagittal sinus is patent. Deep venous system is patent. Transverse and sigmoid sinuses are patent. Jugular veins show flow. No identifiable superficial thrombosis. IMPRESSION: Negative intracranial MR venography. Electronically Signed   By: Paulina FusiMark  Shogry M.D.   On: 04/25/2019 13:20   ASSESSMENT AND PLAN:   1. Right eye blurred vision, rule out acute CVA. Patient was sent from ophthalmology clinic to the ED for further evaluation to rule out CVA. Patient had MRI of the brain without contrast done with no evidence of acute CVA. Neurology consult placed.  Patient seen by neurologist.  Appreciate input.  Patient apparently had previous similar admission in May 2020 with MRI and MRA of the brain done at that time with no acute findings. I have ordered MRI with contrast, MRA as well as MRV of the brain order as  recommended by neurologist.  Ophthalmology consult placed since MRI of the brain negative for acute CVA as recommended by neurologist.  We will also consider heme-onc consult given documentation of prior history of thalassemia to ensure this is not related. 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 Continue aspirin and  statins Carotid arterial duplex this past May reported Minimal amount of bilateral atherosclerotic plaque, right subjectively greater than left, not resulting in a hemodynamically significant stenosis either internal carotid artery.    No indication to repeat  Follow-up on echocardiogram report when done   2.Hypokalemia.  Potassium supplement.  3. Hypertension.  Continue home hypertension medication.  4.Glaucoma, continue home eyedrops. Follow-up on ophthalmology input  DVT prophylaxis; Lovenox   All the records are reviewed and case discussed with Care Management/Social Worker. Management plans discussed with the patient, family and they are in agreement.  CODE STATUS: Full Code  TOTAL TIME TAKING CARE OF THIS PATIENT: 33 minutes.   More than 50% of the time was spent in counseling/coordination of care: YES  POSSIBLE D/C IN 2 DAYS, DEPENDING ON CLINICAL CONDITION.   Chekesha Behlke M.D on 04/25/2019 at 2:03 PM  Between 7am to 6pm - Pager - [phone]  After 6pm go to [email or web address] - Proofreader  [facility] [place] [facility]  Office  [phone]  CC: Primary care physician; Letta Median, MD  Note: This dictation was prepared with Dragon dictation along with smaller phrase technology. Any transcriptional errors that result from this process are unintentional.

## 2019-04-25 NOTE — Progress Notes (Signed)
SLP Cancellation Note  Patient Details Name: Tekelia Kareem MRN: [id number] DOB: Apr 20, 1946   Cancelled treatment:       Reason Eval/Treat Not Completed: SLP screened, no needs identified, will sign off(chart reviewed; consulted NSG/MD then met w/ pt)  Spoke w/ pt in general language and conversation. She gave her name and where she lived(city). She answered basic questions in both a Y/N format accurately. Pt denied any difficulty swallowing and is currently on a regular diet; tolerates swallowing pills w/ water per NSG. She had just finished eating her breakfast meal(on tray table) and denied any problems. Pt conversed w/ SLP appropriately w/out gross deficits noted during general questions or identifying her preferences for drink(milk).   No further immediate skilled ST services indicated as pt appears at her baseline. Encouraged pt to let NSG/MD know if any difficulty w/ speech/swallowing while admitted; pt/family can f/u w/ PCP post discharge if any further questions. Pt agreed. NSG to reconsult if any change in status while status.      Orinda Kenner, MS, CCC-SLP Taiwo Fish 04/25/2019, 9:09 AM

## 2019-04-26 DIAGNOSIS — H353290 Exudative age-related macular degeneration, unspecified eye, stage unspecified: Secondary | ICD-10-CM | POA: Diagnosis present

## 2019-04-26 DIAGNOSIS — H539 Unspecified visual disturbance: Secondary | ICD-10-CM | POA: Diagnosis not present

## 2019-04-26 DIAGNOSIS — M810 Age-related osteoporosis without current pathological fracture: Secondary | ICD-10-CM | POA: Diagnosis present

## 2019-04-26 DIAGNOSIS — H538 Other visual disturbances: Secondary | ICD-10-CM | POA: Diagnosis not present

## 2019-04-26 DIAGNOSIS — K219 Gastro-esophageal reflux disease without esophagitis: Secondary | ICD-10-CM | POA: Diagnosis present

## 2019-04-26 DIAGNOSIS — Z7982 Long term (current) use of aspirin: Secondary | ICD-10-CM | POA: Diagnosis not present

## 2019-04-26 DIAGNOSIS — H548 Legal blindness, as defined in USA: Secondary | ICD-10-CM | POA: Diagnosis present

## 2019-04-26 DIAGNOSIS — Z8673 Personal history of transient ischemic attack (TIA), and cerebral infarction without residual deficits: Secondary | ICD-10-CM | POA: Diagnosis not present

## 2019-04-26 DIAGNOSIS — E876 Hypokalemia: Secondary | ICD-10-CM | POA: Diagnosis present

## 2019-04-26 DIAGNOSIS — H53121 Transient visual loss, right eye: Secondary | ICD-10-CM

## 2019-04-26 DIAGNOSIS — H532 Diplopia: Secondary | ICD-10-CM | POA: Diagnosis present

## 2019-04-26 DIAGNOSIS — Z20828 Contact with and (suspected) exposure to other viral communicable diseases: Secondary | ICD-10-CM | POA: Diagnosis present

## 2019-04-26 DIAGNOSIS — E039 Hypothyroidism, unspecified: Secondary | ICD-10-CM | POA: Diagnosis present

## 2019-04-26 DIAGNOSIS — B0052 Herpesviral keratitis: Secondary | ICD-10-CM | POA: Diagnosis present

## 2019-04-26 DIAGNOSIS — H402213 Chronic angle-closure glaucoma, right eye, severe stage: Secondary | ICD-10-CM | POA: Diagnosis present

## 2019-04-26 DIAGNOSIS — D569 Thalassemia, unspecified: Secondary | ICD-10-CM | POA: Diagnosis present

## 2019-04-26 DIAGNOSIS — D582 Other hemoglobinopathies: Secondary | ICD-10-CM

## 2019-04-26 DIAGNOSIS — E785 Hyperlipidemia, unspecified: Secondary | ICD-10-CM | POA: Diagnosis present

## 2019-04-26 DIAGNOSIS — Z79899 Other long term (current) drug therapy: Secondary | ICD-10-CM | POA: Diagnosis not present

## 2019-04-26 DIAGNOSIS — Z23 Encounter for immunization: Secondary | ICD-10-CM | POA: Diagnosis not present

## 2019-04-26 DIAGNOSIS — H402220 Chronic angle-closure glaucoma, left eye, stage unspecified: Secondary | ICD-10-CM | POA: Diagnosis present

## 2019-04-26 DIAGNOSIS — I10 Essential (primary) hypertension: Secondary | ICD-10-CM | POA: Diagnosis present

## 2019-04-26 DIAGNOSIS — E559 Vitamin D deficiency, unspecified: Secondary | ICD-10-CM | POA: Diagnosis present

## 2019-04-26 LAB — CBC
HCT: 28.9 % — ABNORMAL LOW (ref 36.0–46.0)
Hemoglobin: 9.4 g/dL — ABNORMAL LOW (ref 12.0–15.0)
MCH: 20.7 pg — ABNORMAL LOW (ref 26.0–34.0)
MCHC: 32.5 g/dL (ref 30.0–36.0)
MCV: 63.7 fL — ABNORMAL LOW (ref 80.0–100.0)
Platelets: 158 10*3/uL (ref 150–400)
RBC: 4.54 MIL/uL (ref 3.87–5.11)
RDW: 15.7 % — ABNORMAL HIGH (ref 11.5–15.5)
WBC: 8.3 10*3/uL (ref 4.0–10.5)
nRBC: 0 % (ref 0.0–0.2)

## 2019-04-26 LAB — BASIC METABOLIC PANEL
Anion gap: 7 (ref 5–15)
BUN: 17 mg/dL (ref 8–23)
CO2: 15 mmol/L — ABNORMAL LOW (ref 22–32)
Calcium: 8.5 mg/dL — ABNORMAL LOW (ref 8.9–10.3)
Chloride: 119 mmol/L — ABNORMAL HIGH (ref 98–111)
Creatinine, Ser: 0.79 mg/dL (ref 0.44–1.00)
GFR calc Af Amer: >60 mL/min (ref >60)
GFR calc non Af Amer: >60 mL/min (ref >60)
Glucose, Bld: 91 mg/dL (ref 70–99)
Potassium: 3.1 mmol/L — ABNORMAL LOW (ref 3.5–5.1)
Sodium: 141 mmol/L (ref 135–145)

## 2019-04-26 LAB — MAGNESIUM: Magnesium: 1.9 mg/dL (ref 1.7–2.4)

## 2019-04-26 MED ORDER — POTASSIUM CHLORIDE CRYS ER 20 MEQ PO TBCR
40.0000 meq | EXTENDED_RELEASE_TABLET | Freq: Once | ORAL | Status: AC
  Administered 2019-04-26: 40 meq via ORAL
  Filled 2019-04-26: qty 2

## 2019-04-26 NOTE — Progress Notes (Signed)
Rachel [name] at [name] NAME: Rachel [name]    MR#:  [id number]  DATE OF BIRTH:  [birth year]  SUBJECTIVE:  CHIEF COMPLAINT:   Chief Complaint  Patient presents with  . Loss of Vision   No new complaint this morning.  Initially admitted for evaluation of intermittent blurry vision on the right eye.  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  Seen by ophthalmologist last night.  REVIEW OF SYSTEMS:  ROS  Constitutional: Negative for chills, fever and malaise/fatigue.  HENT: Negative for sore throat.   Eyes: Positive for blurred vision. Negative for double vision.       Blurry vision.  Respiratory: Negative for cough, hemoptysis, shortness of breath, wheezing and stridor.   Cardiovascular: Negative for chest pain, palpitations, orthopnea and leg swelling.  Gastrointestinal: Negative for abdominal pain, blood in stool, diarrhea, melena, nausea and vomiting.  Genitourinary: Negative for dysuria, flank pain and hematuria.  Musculoskeletal: Negative for back pain and joint pain.  Skin: Negative for rash.  Neurological: Negative for dizziness, sensory change, focal weakness, seizures, loss of consciousness, weakness and headaches.  Endo/Heme/Allergies: Negative for polydipsia.  Psychiatric/Behavioral: Negative for depression. The patient is not nervous/anxious.     DRUG ALLERGIES:  No Known Allergies VITALS:  Blood pressure (!) 145/77, pulse 65, temperature 98.4 F (36.9 C), temperature source Oral, resp. rate 18, height 5' (1.524 m), weight 49.9 kg, SpO2 98 %. PHYSICAL EXAMINATION:  Physical Exam  GENERAL:  73 y.o.-year-old patient lying in the bed with no acute distress.  EYES: LEFT eye blindness.  No scleral icterus. Extraocular muscles intact.  HEENT: Head atraumatic, normocephalic. NECK:  Supple, no jugular venous distention. No thyroid enlargement, no tenderness.  LUNGS: Normal breath sounds bilaterally, no wheezing, rales,rhonchi or  crepitation. No use of accessory muscles of respiration.  CARDIOVASCULAR: S1, S2 normal. No murmurs, rubs, or gallops.  ABDOMEN: Soft, nontender, nondistended. Bowel sounds present. No organomegaly or mass.  EXTREMITIES: No pedal edema, cyanosis, or clubbing.  NEUROLOGIC: Cranial nerves II through XII are intact. Muscle strength 5/5 in all extremities. Sensation intact. Gait not checked.  PSYCHIATRIC: The patient is alert and oriented x 3.  SKIN: No obvious rash, lesion, or ulcer.   LABORATORY PANEL:  Female CBC Recent Labs  Lab 04/26/19 0401  WBC 8.3  HGB 9.4*  HCT 28.9*  PLT 158   ------------------------------------------------------------------------------------------------------------------ Chemistries  Recent Labs  Lab 04/24/19 1130  04/26/19 0401  NA 141   < > 141  K 3.3*   < > 3.1*  CL 114*   < > 119*  CO2 20*   < > 15*  GLUCOSE 96   < > 91  BUN 17   < > 17  CREATININE 0.94   < > 0.79  CALCIUM 9.6   < > 8.5*  MG  --    < > 1.9  AST 22  --   --   ALT 18  --   --   ALKPHOS 57  --   --   BILITOT 0.8  --   --    < > = values in this interval not displayed.   RADIOLOGY:  No results found. ASSESSMENT AND PLAN:   1. Right eye blurred vision, rule out acute CVA. Patient was sent from ophthalmology clinic to the ED for further evaluation to rule out CVA. Patient had MRI of the brain without contrast done with no evidence of acute CVA. Neurology consult placed.  Patient seen by neurologist.  Appreciate input.  Patient apparently had previous similar admission in May 2020 with MRI and MRA of the brain done at that time with no acute findings. During this admission patient had MRI of the brain with and without contrast.  No acute intracranial finding.  No abnormal enhancement.  Patient also had MRV of the head done with no large or medium vessel occlusion.Some tortuosity and irregularity with mild ectasia of the distal cervical ICA on the left suggesting underlying  fibromuscular disease. No evidence of stenosis or pseudo aneurysm.  Patient also had negative intracranial MR venography. Patient seen by ophthalmologist yesterday who recommended getting ESR and C-reactive protein which were drawn and unremarkable.  No indication for temporal artery biopsy at this time.  To follow-up with ophthalmologist Dr. Murvin Natal in 2 weeks following discharge. Discussed case with neurologist and he recommended getting hematology evaluation since patient has documented history of thalassemia see if this could be related to the intermittent blurry vision. Follow-up on evaluation by hematologist. 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 Continue aspirin and statins Carotid arterial duplex this past May reported Minimal amount of bilateral atherosclerotic plaque, right subjectively greater than left, not resulting in a hemodynamically significant stenosis either internal carotid artery.    No indication to repeat   2.Hypokalemia.  Potassium supplement.  3. Hypertension.  Continue home hypertension medication.  4.Glaucoma, continue home eyedrops. Follow-up on ophthalmology input  DVT prophylaxis; Lovenox   All the records are reviewed and case discussed with Care Management/Social Worker. Management plans discussed with the patient, family and they are in agreement. Called patient's son listed in the chart Mr. Rachel [name].  Updated him on treatment plans. Plans for discharge tomorrow pending further recommendations from hematologist.  CODE STATUS: Full Code  TOTAL TIME TAKING CARE OF THIS PATIENT: 31 minutes.   More than 50% of the time was spent in counseling/coordination of care: YES  POSSIBLE D/C IN 1-2 DAYS, DEPENDING ON CLINICAL CONDITION.   [name] M.D on 04/26/2019 at 1:14 PM  Between 7am to 6pm - Pager - [phone]  After 6pm go to [email or web address] - Proofreader  [facility]  [facility]  Office  [phone]  CC:  Primary care physician; Rachel Median, MD  Note: This dictation was prepared with Dragon dictation along with smaller phrase technology. Any transcriptional errors that result from this process are unintentional.

## 2019-04-26 NOTE — Consult Note (Signed)
[facility]  Date of admission:  04/24/2019  Inpatient day:  04/26/2019  Consulting physician:  Margarita Sermons  Reason for Consultation:  Patient with a history of thalassemia.  Can this be explained by history of thalassemia.  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  HPI: Patient has a history of glaucoma in the left eye resulting in loss of vision.  She states that she has had 2-3 surgeries in the left eye and had had issues with infection.  She describes "1 to 2 minutes of dark vision" in the right eye on Thursday, 04/23/2019.  She had a similar episode in 11/2018 with decreased visual acuity and no acute funduscopic changes.  MRA contrast on 04/25/2019 revealed no large or medium vessel occlusion or correctable proximal stenosis.  There was some tortuosity and irregularity with mild ectasia of the distal cervical ICA on the left suggesting underlying fibromuscular disease.  There was no stenosis or pseudoaneurysm.  Head MRI without contrast revealed no acute intracranial abnormality and extensive white matter changes chronic (stable compared to 11/25/2018).  Head MRI with contrast revealed no abnormal enhancement of the brain or leptomeninges.  There was no vascular occlusion.  Head MRV revealed negative an MRI venography.    CRP was < 0.8 and sed rate 24 on 04/25/2019.    Hemoglobin electrophoresis on 10/17/2015 revealed 94.2% hemoglobin variant and 5.8% hemoglobin A2 the pattern consistent with homozygous E disease.  Review of CBCs dating back to 08/15/2015 revealed a hemoglobin typically ranging between 10.8 -11.4.  RBCs are microcytic with an MCV 63.0 - 63.7.  She may have had a transfusion in the past.  She has never been on oral chelation therapy.    She denies any fevers, sweats or weight loss.  She denies any headache, numbness or tingling, balance or coordination issues.  She  has never had a DVT or PE.  She denies any family history of any blood disorders.   Past Medical History:  Diagnosis Date  . GERD (gastroesophageal reflux disease)   . Glaucoma   . Hypertension   . Osteoporosis   . Vitamin D deficiency     Past Surgical History:  Procedure Laterality Date  . ABDOMINAL HYSTERECTOMY    . CATARACT EXTRACTION W/ INTRAOCULAR LENS IMPLANT    . PHOTOCOAGULATION WITH LASER Left 08/27/2018   Procedure: [place] TRANSCLERAL DIODE CYCLOPHOTOCOAGULATION;  Surgeon: Leandrew Koyanagi, MD;  Location: [facility];  Service: Ophthalmology;  Laterality: Left;    Family History  Family history unknown: Yes    Social History:  reports that she has never smoked. She has never used smokeless tobacco. She reports previous alcohol use. She reports that she does not use drugs.  She is from [place].  She moved to the [place] in 1987.  Allergies: No Known Allergies  Medications Prior to Admission  Medication Sig Dispense Refill  . acetaZOLAMIDE (DIAMOX) 500 MG capsule Take 500 mg by mouth 2 (two) times daily.    Marland Kitchen aspirin EC 81 MG EC tablet Take 1 tablet (81 mg total) by mouth daily. 30 tablet 0  . atorvastatin (LIPITOR) 40 MG tablet Take 1 tablet (40 mg total) by mouth daily at 6 PM. 30 tablet 0  . brimonidine (ALPHAGAN) 0.2 % ophthalmic solution Place 1 drop into both eyes 2 (two) times daily.   0  . Calcium Carbonate-Vitamin D (CALCIUM 600+D) 600-400 MG-UNIT tablet  Take 1 tablet by mouth 2 (two) times daily.    . dorzolamide-timolol (COSOPT) 22.3-6.8 MG/ML ophthalmic solution Place 1 drop into both eyes 2 (two) times daily.   0  . latanoprost (XALATAN) 0.005 % ophthalmic solution Place 1 drop into both eyes at bedtime.    . pantoprazole (PROTONIX) 40 MG tablet Take 40 mg by mouth daily.   0    Review of Systems: GENERAL:  Feels good. No fevers, sweats or weight loss. PERFORMANCE STATUS (ECOG):  1 HEENT:  Left eye blind (glaucoma).  Intermittent right eye  visual loss.  No  runny nose, sore throat, mouth sores or tenderness. Lungs: No shortness of breath or cough.  No hemoptysis. Cardiac:  No chest pain, palpitations, orthopnea, or PND. GI:  No nausea, vomiting, diarrhea, constipation, melena or hematochezia. GU:  No urgency, frequency, dysuria, or hematuria. Musculoskeletal:  No back pain.  No joint pain.  No muscle tenderness. Extremities:  No pain or swelling. Skin:  No rashes or skin changes. Neuro:  No headache, numbness or weakness, balance or coordination issues. Endocrine:  No diabetes, thyroid issues, hot flashes or night sweats. Psych:  No mood changes, depression or anxiety. Pain:  No focal pain. Review of systems:  All other systems reviewed and found to be negative.  Physical Exam:  Blood pressure (!) 145/77, pulse 65, temperature 98.4 F (36.9 C), temperature source Oral, resp. rate 18, height 5' (1.524 m), weight 110 lb (49.9 kg), SpO2 98 %.  GENERAL:  Well developed, well nourished, woman sitting comfortably on the medical unit in no acute distress. MENTAL STATUS:  Alert and oriented to person, place and time. HEAD:  Short styled gray hair.  Normocephalic, atraumatic, face symmetric, no Cushingoid features. EYES:  Left eye cloudy.  Right eye brown and reactive to light.  No conjunctivitis or scleral icterus. ENT:  Oropharynx clear without lesion.  Tongue normal. Mucous membranes moist.  RESPIRATORY:  Clear to auscultation without rales, wheezes or rhonchi. CARDIOVASCULAR:  Regular rate and rhythm without murmur, rub or gallop. ABDOMEN:  Soft, non-tender, with active bowel sounds, and no hepatosplenomegaly.  No masses. SKIN:  No rashes, ulcers or lesions. EXTREMITIES: No edema, no skin discoloration or tenderness.  No palpable cords. LYMPH NODES: No palpable cervical, supraclavicular, axillary or inguinal adenopathy  NEUROLOGICAL: Unremarkable. PSYCH:  Appropriate.   Results for orders placed or performed during the  hospital encounter of 04/24/19 (from the past 48 hour(s))  CBC     Status: Abnormal   Collection Time: 04/24/19  3:15 PM  Result Value Ref Range   WBC 7.0 4.0 - 10.5 K/uL   RBC 5.18 (H) 3.87 - 5.11 MIL/uL   Hemoglobin 10.8 (L) 12.0 - 15.0 g/dL   HCT 32.8 (L) 36.0 - 46.0 %   MCV 63.3 (L) 80.0 - 100.0 fL   MCH 20.8 (L) 26.0 - 34.0 pg   MCHC 32.9 30.0 - 36.0 g/dL   RDW 15.9 (H) 11.5 - 15.5 %   Platelets 179 150 - 400 K/uL   nRBC 0.0 0.0 - 0.2 %    Comment: Performed at [facility], [address] [place], [place] [address]  Magnesium     Status: None   Collection Time: 04/24/19  3:15 PM  Result Value Ref Range   Magnesium 2.1 1.7 - 2.4 mg/dL    Comment: Performed at [facility], [address] [place], [place] [address]  Lipid panel     Status: None   Collection  Time: 04/25/19  4:57 AM  Result Value Ref Range   Cholesterol 135 0 - 200 mg/dL   Triglycerides 45 <150 mg/dL   HDL 73 >40 mg/dL   Total CHOL/HDL Ratio 1.8 RATIO   VLDL 9 0 - 40 mg/dL   LDL Cholesterol 53 0 - 99 mg/dL    Comment:        Total Cholesterol/HDL:CHD Risk Coronary Heart Disease Risk Table                     Men   Women  1/2 Average Risk   3.4   3.3  Average Risk       5.0   4.4  2 X Average Risk   9.6   7.1  3 X Average Risk  23.4   11.0        Use the calculated Patient Ratio above and the CHD Risk Table to determine the patient's CHD Risk.        ATP III CLASSIFICATION (LDL):  <100     mg/dL   Optimal  100-129  mg/dL   Near or Above                    Optimal  130-159  mg/dL   Borderline  160-189  mg/dL   High  >190     mg/dL   Very High Performed at [facility], [address] [place], [place] [address]   Basic metabolic panel     Status: Abnormal   Collection Time: 04/25/19  5:00 AM  Result Value Ref Range   Sodium 141 135 - 145 mmol/L   Potassium 3.4 (L) 3.5 - 5.1 mmol/L   Chloride 117 (H) 98 - 111 mmol/L   CO2 17 (L) 22 - 32 mmol/L   Glucose, Bld 84  70 - 99 mg/dL   BUN 16 8 - 23 mg/dL   Creatinine, Ser 1.09 (H) 0.44 - 1.00 mg/dL   Calcium 8.9 8.9 - 10.3 mg/dL   GFR calc non Af Amer 51 (L) >60 mL/min   GFR calc Af Amer 59 (L) >60 mL/min   Anion gap 7 5 - 15    Comment: Performed at [facility], [address] [place], [place] [address]  ESR     Status: None   Collection Time: 04/25/19  3:50 PM  Result Value Ref Range   Sed Rate 24 0 - 30 mm/hr    Comment: Performed at [facility], [address] [place], [place] [address]  C-reactive protein     Status: None   Collection Time: 04/25/19  3:50 PM  Result Value Ref Range   CRP <0.8 <1.0 mg/dL    Comment: Performed at [facility] [address], [place], [place] [address]  Basic metabolic panel     Status: Abnormal   Collection Time: 04/26/19  4:01 AM  Result Value Ref Range   Sodium 141 135 - 145 mmol/L   Potassium 3.1 (L) 3.5 - 5.1 mmol/L   Chloride 119 (H) 98 - 111 mmol/L   CO2 15 (L) 22 - 32 mmol/L   Glucose, Bld 91 70 - 99 mg/dL   BUN 17 8 - 23 mg/dL   Creatinine, Ser 0.79 0.44 - 1.00 mg/dL   Calcium 8.5 (L) 8.9 - 10.3 mg/dL   GFR calc non Af Amer >60 >60 mL/min   GFR calc Af Amer >60 >60 mL/min   Anion gap 7  5 - 15    Comment: Performed at [facility], [address] [place], [place] [address]  CBC     Status: Abnormal   Collection Time: 04/26/19  4:01 AM  Result Value Ref Range   WBC 8.3 4.0 - 10.5 K/uL   RBC 4.54 3.87 - 5.11 MIL/uL   Hemoglobin 9.4 (L) 12.0 - 15.0 g/dL   HCT 28.9 (L) 36.0 - 46.0 %   MCV 63.7 (L) 80.0 - 100.0 fL   MCH 20.7 (L) 26.0 - 34.0 pg   MCHC 32.5 30.0 - 36.0 g/dL   RDW 15.7 (H) 11.5 - 15.5 %   Platelets 158 150 - 400 K/uL   nRBC 0.0 0.0 - 0.2 %    Comment: Performed at [facility], [address], [place], [place] [address]  Magnesium     Status: None   Collection Time: 04/26/19  4:01 AM  Result Value Ref Range   Magnesium 1.9 1.7 - 2.4 mg/dL    Comment: Performed at [facility], [address] [place], [place] [address]   Mr Angio Head Wo Contrast  Result Date: 04/25/2019 CLINICAL DATA:  Head trauma.  Visual disturbance. EXAM: MRA HEAD WITHOUT CONTRAST TECHNIQUE: Angiographic images of the Circle of Willis were obtained using MRA technique without intravenous contrast. COMPARISON:  MRI yesterday and today. FINDINGS: Both internal carotid arteries are patent through the skull base and siphon regions. There is tortuosity and irregularity of the upper cervical ICA on the left most consistent with fibromuscular disease. No evidence of flow limiting stenosis or pseudo aneurysm. The anterior and middle cerebral vessels are patent without proximal stenosis, aneurysm or vascular malformation. Both vertebral arteries are patent with the right being dominant. No basilar stenosis. Posterior circulation branch vessels are normal. IMPRESSION: No large or medium vessel occlusion or correctable proximal stenosis. Some tortuosity and irregularity with mild ectasia of the distal cervical ICA on the left suggesting underlying fibromuscular disease. No evidence of stenosis or pseudo aneurysm. Electronically Signed   By: Nelson Chimes M.D.   On: 04/25/2019 13:19   Mr Brain Wo Contrast  Result Date: 04/24/2019 CLINICAL DATA:  TIA.  Decreased vision right eye EXAM: MRI HEAD WITHOUT CONTRAST TECHNIQUE: Multiplanar, multiecho pulse sequences of the brain and surrounding structures were obtained without intravenous contrast. COMPARISON:  MRI head 11/25/2018 FINDINGS: Brain: Negative for acute infarct. Extensive white matter changes similar to the prior study. Numerous small and moderate size white matter hyperintensities throughout the subcortical and deep white matter bilaterally. Negative for mass lesion. No hemorrhage or fluid collection. Ventricle size normal. Vascular: Normal arterial flow voids Skull and upper cervical spine: Negative Sinuses/Orbits: Negative Other: None IMPRESSION: No  acute intracranial abnormality Extensive white matter changes which are chronic and stable compared with Nov 25, 2018 MRI. Findings most compatible with chronic microvascular ischemia. Electronically Signed   By: Franchot Gallo M.D.   On: 04/24/2019 16:25   Mr Brain W Contrast  Result Date: 04/25/2019 CLINICAL DATA:  Head trauma. Visual disturbance. Diplopia. Follow-up noncontrast exam yesterday. EXAM: MRI HEAD WITH CONTRAST TECHNIQUE: Multiplanar, multiecho pulse sequences of the brain and surrounding structures were obtained with intravenous contrast. CONTRAST:  43m GADAVIST GADOBUTROL 1 MMOL/ML IV SOLN COMPARISON:  04/24/2019 FINDINGS: Brain: Moderate chronic small-vessel ischemic changes of the hemispheric deep and subcortical white matter were better demonstrated on the full exam done yesterday. Today's contrast enhanced images do not show any abnormal brain or leptomeningeal enhancement. No sign of vascular  occlusion. Vascular: No sign of vascular occlusion. Skull and upper cervical spine: Negative Sinuses/Orbits: Negative Other: None IMPRESSION: No abnormal enhancement of the brain or leptomeninges. No evidence of vascular occlusion. Moderate chronic appearing small vessel changes of the brain demonstrated better on yesterday's full exam. Electronically Signed   By: Nelson Chimes M.D.   On: 04/25/2019 13:17   Mr Mrv Head Wo Cm  Result Date: 04/25/2019 CLINICAL DATA:  Head trauma.  Visual loss.  Diplopia. EXAM: MR VENOGRAM OF THE HEAD WITHOUT CONTRAST TECHNIQUE: Angiographic images of the intracranial venous structures were obtained using MRV technique without intravenous contrast. COMPARISON:  MRI studies yesterday and today. FINDINGS: Superior sagittal sinus is patent. Deep venous system is patent. Transverse and sigmoid sinuses are patent. Jugular veins show flow. No identifiable superficial thrombosis. IMPRESSION: Negative intracranial MR venography. Electronically Signed   By: Nelson Chimes M.D.    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  MRA contrast on 04/25/2019 revealed no large or medium vessel occlusion or correctable proximal stenosis.  There was some tortuosity and irregularity with mild ectasia of the distal cervical ICA on the left suggesting underlying fibromuscular disease.  There was no stenosis or pseudoaneurysm.  Head MRI without contrast revealed no acute intracranial abnormality and extensive white matter changes chronic (stable compared to 11/25/2018).  Head MRI with contrast revealed no abnormal enhancement of the brain or leptomeninges.  There was no vascular occlusion.  Head MRV revealed negative an MRI venography.    CRP was < 0.8 and sed rate 24 on 04/25/2019.    Hemoglobin electrophoresis on 10/17/2015 revealed 94.2% hemoglobin variant and 5.8% hemoglobin A2 the pattern consistent with homozygous E disease.  Review of CBCs dating back to 08/15/2015 revealed a hemoglobin typically ranging between 10.8 -11.4.  RBCs are microcytic with an MCV 63.0 - 63.7.  Symptomatically, she denies any headache, numbness, weakness, balance or coordination issues.  She denies any history of prior DVT.  Plan:   1.   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  Red blood cells are hypochromic, microcytic with some target cells.  There is no relation with hemoglobin E and loss of vision.  Patients with beta thalassemia (patient does not have) can have visual issues with    iron overload, side effects of oral chelation, and complications of orbital bone marrow expansion.  Patient  notes some history of transfusion but no apparent history of oral chelation.   Check iron studies. 2.   Transient loss of right sided vision  Etiology unclear.  Rule out possible hypercoagulable state with PNH testing, lupus anticoagulant testing, prothrombin gene mutation, and Factor V Leiden.  No current evidence of vasculitis with MRI/MRA and normal CRP/sed rate    Thank you for allowing me to participate in Rachel [name] 's care.  I will follow he closely with you while hospitalized and after discharge in the outpatient department.   Lequita Asal, MD  04/26/2019, 2:14 PM

## 2019-04-27 LAB — IRON AND TIBC
Iron: 83 ug/dL (ref 28–170)
Saturation Ratios: 41 % — ABNORMAL HIGH (ref 10.4–31.8)
TIBC: 203 ug/dL — ABNORMAL LOW (ref 250–450)
UIBC: 120 ug/dL

## 2019-04-27 LAB — MAGNESIUM: Magnesium: 2.1 mg/dL (ref 1.7–2.4)

## 2019-04-27 LAB — HEMOGLOBIN A1C
Hgb A1c MFr Bld: 5.2 % (ref 4.8–5.6)
Mean Plasma Glucose: 103 mg/dL

## 2019-04-27 LAB — BASIC METABOLIC PANEL
Anion gap: 3 — ABNORMAL LOW (ref 5–15)
BUN: 14 mg/dL (ref 8–23)
CO2: 18 mmol/L — ABNORMAL LOW (ref 22–32)
Calcium: 8.6 mg/dL — ABNORMAL LOW (ref 8.9–10.3)
Chloride: 118 mmol/L — ABNORMAL HIGH (ref 98–111)
Creatinine, Ser: 0.83 mg/dL (ref 0.44–1.00)
GFR calc Af Amer: >60 mL/min (ref >60)
GFR calc non Af Amer: >60 mL/min (ref >60)
Glucose, Bld: 87 mg/dL (ref 70–99)
Potassium: 3.3 mmol/L — ABNORMAL LOW (ref 3.5–5.1)
Sodium: 139 mmol/L (ref 135–145)

## 2019-04-27 LAB — FERRITIN: Ferritin: 297 ng/mL (ref 11–307)

## 2019-04-27 MED ORDER — POTASSIUM CHLORIDE CRYS ER 20 MEQ PO TBCR
40.0000 meq | EXTENDED_RELEASE_TABLET | Freq: Once | ORAL | Status: AC
  Administered 2019-04-27: 40 meq via ORAL
  Filled 2019-04-27: qty 2

## 2019-04-27 MED ORDER — ASPIRIN 325 MG PO TABS: 325.0000 mg | ORAL_TABLET | Freq: Every day | ORAL | 0 refills | Status: DC

## 2019-04-27 NOTE — TOC Initial Note (Signed)
Transition of Care [facility]) - Initial/Assessment Note    Patient Details  Name: Rachel [name] MRN: [id number] Date of Birth: 12-Aug-1945  Transition of Care [facility]) CM/SW Contact:    Shelbie Hutching, RN Phone Number: 04/27/2019, 1:55 PM  Clinical Narrative:                 Patient admitted for blurry vision.  Patient is from home and lives with her son.  Patient is independent at baseline and requires no assistive devices.  PT recommends no follow up but OT recommends OP OT.  Patient is agreeable to OP OT, referral faxed to [facility] outpatient rehab department.  Patient's son is coming to pick her up at discharge today.   Expected Discharge Plan: OP Rehab Barriers to Discharge: No Barriers Identified   Patient Goals and CMS Choice        Expected Discharge Plan and Services Expected Discharge Plan: OP Rehab       Living arrangements for the past 2 months: Single Family Home Expected Discharge Date: 04/27/19                                    Prior Living Arrangements/Services Living arrangements for the past 2 months: Single Family Home Lives with:: Adult Children(Son Rachel [name]) Patient language and need for interpreter reviewed:: No Do you feel safe going back to the place where you live?: Yes      Need for Family Participation in Patient Care: Yes (Comment)(vision loss) Care giver support system in place?: Yes (comment)(son)   Criminal Activity/Legal Involvement Pertinent to Current Situation/Hospitalization: No - Comment as needed  Activities of Daily Living Home Assistive Devices/Equipment: None ADL Screening (condition at time of admission) Patient's cognitive ability adequate to safely complete daily activities?: Yes Is the patient deaf or have difficulty hearing?: No Does the patient have difficulty seeing, even when wearing glasses/contacts?: Yes Does the patient have difficulty concentrating, remembering, or making decisions?: No Patient able to express need for  assistance with ADLs?: Yes Does the patient have difficulty dressing or bathing?: No Independently performs ADLs?: Yes (appropriate for developmental age) Does the patient have difficulty walking or climbing stairs?: Yes Weakness of Legs: None Weakness of Arms/Hands: None  Permission Sought/Granted Permission sought to share information with : Case Manager, Family Supports Permission granted to share information with : Yes, Verbal Permission Granted        Permission granted to share info w Relationship: Son Rachel [name]     Emotional Assessment Appearance:: Appears stated age Attitude/Demeanor/Rapport: Engaged Affect (typically observed): Accepting Orientation: : Oriented to Self, Oriented to Place, Oriented to  Time, Oriented to Situation Alcohol / Substance Use: Not Applicable Psych Involvement: No (comment)  Admission diagnosis:  Vision changes [H53.9] Patient Active Problem List   Diagnosis Date Noted  . Vision changes 04/26/2019  . Blurry vision, right eye 04/24/2019  . TIA (transient ischemic attack) 11/24/2018  . Nausea & vomiting 07/10/2017  . GERD (gastroesophageal reflux disease) 07/10/2017  . Gastroenteritis 07/10/2017  . Preop cardiovascular exam 10/07/2014  . Essential hypertension 10/07/2014  . Hyperlipidemia 10/07/2014  . Vision loss of left eye 10/07/2014   PCP:  Letta Median, MD Pharmacy:   [facility] [address] [place] - [place] [facility] [place] [address] Phone: [phone] Fax: [phone]     Social Determinants of Health (SDOH) Interventions  Readmission Risk Interventions No flowsheet data found.

## 2019-04-27 NOTE — Discharge Summary (Signed)
[facility] [place] NAME: Rachel [name]    MR#:  [id number]  DATE OF BIRTH:  [birth year]  DATE OF ADMISSION:  04/24/2019   ADMITTING PHYSICIAN: Demetrios Loll, MD  DATE OF DISCHARGE: 04/27/2019.  PRIMARY CARE PHYSICIAN: Letta Median, MD   ADMISSION DIAGNOSIS:  Vision changes [H53.9] DISCHARGE DIAGNOSIS:  Active Problems:   Blurry vision, right eye   Vision changes  SECONDARY DIAGNOSIS:   Past Medical History:  Diagnosis Date  . GERD (gastroesophageal reflux disease)   . Glaucoma   . Hypertension   . Osteoporosis   . Vitamin D deficiency    HOSPITAL COURSE:  Chief complaint; loss of vision  History of presenting complaint; Rachel [name]  is a 73 y.o. female with a known history of hypertension, glaucoma, osteoporosis, vitamin D deficiency and GERD.  The patient is sent from ophthalmology clinic to ED due to above chief complaints.  She denies any focal numbness and weakness, no headache, dizziness or incontinence.  She has similar symptoms earlier this year, work-up at that time was unremarkable.  CAT scan of the head is unremarkable at this time.  ED physician request admission to rule out CVA.  Hospital course;  1. Righteyeblurred vision, rule out acute CVA. Patient was sent from ophthalmology clinic to the ED for further evaluation to rule out CVA. Patient had MRI of the brain without contrast done with no evidence of acute CVA. Neurology consult placed.  Patient seen by neurologist.  Appreciate input.  Patient apparently had previous similar admission in May 2020 with MRI and MRA of the brain done at that time with no acute findings. During this admission patient had MRI of the brain with and without contrast.  No acute intracranial finding.  No abnormal enhancement.  Patient also had MRV of the head done with no large or medium vessel occlusion.Some tortuosity and irregularity with mild ectasia of the distal cervical  ICA on the left suggesting underlying fibromuscular disease. No evidence of stenosis or pseudo aneurysm.  Patient also had negative intracranial MR venography. Patient seen by ophthalmologist yesterday who recommended getting ESR and C-reactive protein which were drawn and unremarkable.  No indication for temporal artery biopsy at this time.  To follow-up with ophthalmologist Dr. Murvin Natal in 2 weeks following discharge. Discussed case with neurologist and he recommended getting hematology evaluation.  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  Patient does not have history of thalassemia.  Further work-up including work-up for possible hypercoagulable state with PNH testing, lupus anticoagulant testing, prothrombin gene mutation, and Factor V Leiden. No current evidence of vasculitis with MRI/MRA and normal CRP/sed rate.  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 Continue aspirin and statins.Carotid arterial duplex this past May reportedMinimal amount of bilateral atherosclerotic plaque, right subjectively greater than left, not resulting in a hemodynamically significant stenosis either internal carotid artery.  No indication to repeat .  Appointment made to follow-up with ophthalmologist 2.Hypokalemia. Potassium supplement prior to discharge 3. Hypertension. Continue home hypertension medication. 4.Glaucoma, continue home eyedrops. Follow-up on ophthalmology input Patient seen by physical therapist.  No further needs recommended.   DISCHARGE CONDITIONS:  Stable CONSULTS OBTAINED:  Treatment Team:  Neville Route, MD Leandrew Koyanagi, MD Lequita Asal, MD DRUG ALLERGIES:  No Known Allergies DISCHARGE MEDICATIONS:   Allergies as of 04/27/2019   No Known Allergies  Medication List    STOP taking these medications   aspirin 81 MG EC  tablet Replaced by: aspirin 325 MG tablet     TAKE these medications   acetaZOLAMIDE 500 MG capsule Commonly known as: DIAMOX Take 500 mg by mouth 2 (two) times daily.   aspirin 325 MG tablet Take 1 tablet (325 mg total) by mouth daily. Start taking on: April 28, 2019 Replaces: aspirin 81 MG EC tablet   atorvastatin 40 MG tablet Commonly known as: LIPITOR Take 1 tablet (40 mg total) by mouth daily at 6 PM.   brimonidine 0.2 % ophthalmic solution Commonly known as: ALPHAGAN Place 1 drop into both eyes 2 (two) times daily.   Calcium 600+D 600-400 MG-UNIT tablet Generic drug: Calcium Carbonate-Vitamin D Take 1 tablet by mouth 2 (two) times daily.   dorzolamide-timolol 22.3-6.8 MG/ML ophthalmic solution Commonly known as: COSOPT Place 1 drop into both eyes 2 (two) times daily.   latanoprost 0.005 % ophthalmic solution Commonly known as: XALATAN Place 1 drop into both eyes at bedtime.   pantoprazole 40 MG tablet Commonly known as: PROTONIX Take 40 mg by mouth daily.        DISCHARGE INSTRUCTIONS:   DIET:  Cardiac diet DISCHARGE CONDITION:  Stable ACTIVITY:  Activity as tolerated OXYGEN:  Home Oxygen: No.  Oxygen Delivery: room air DISCHARGE LOCATION:  home   If you experience worsening of your admission symptoms, develop shortness of breath, life threatening emergency, suicidal or homicidal thoughts you must seek medical attention immediately by calling 911 or calling your MD immediately  if symptoms less severe.  You Must read complete instructions/literature along with all the possible adverse reactions/side effects for all the Medicines you take and that have been prescribed to you. Take any new Medicines after you have completely understood and accpet all the possible adverse reactions/side effects.   Please note  You were cared for by a hospitalist during your hospital stay. If you have any questions about your discharge medications or the care you  received while you were in the hospital after you are discharged, you can call the unit and asked to speak with the hospitalist on call if the hospitalist that took care of you is not available. Once you are discharged, your primary care physician will handle any further medical issues. Please note that NO REFILLS for any discharge medications will be authorized once you are discharged, as it is imperative that you return to your primary care physician (or establish a relationship with a primary care physician if you do not have one) for your aftercare needs so that they can reassess your need for medications and monitor your lab values.    On the day of Discharge:  VITAL SIGNS:  Blood pressure 133/69, pulse (!) 57, temperature 97.7 F (36.5 C), resp. rate 16, height 5' (1.524 m), weight 49.9 kg, SpO2 97 %. PHYSICAL EXAMINATION:  GENERAL:  73 y.o.-year-old patient lying in the bed with no acute distress.  EYES: Patient is legally blind on the left eye.  No right eye blurry vision at this time.  Pupils equal, round, reactive to light and accommodation. No scleral icterus. HEENT: Head atraumatic, normocephalic. Oropharynx and nasopharynx clear.  NECK:  Supple, no jugular venous distention. No thyroid enlargement, no tenderness.  LUNGS: Normal breath sounds bilaterally, no wheezing, rales,rhonchi or crepitation. No use of accessory muscles of respiration.  CARDIOVASCULAR: S1, S2 normal. No murmurs, rubs, or gallops.  ABDOMEN: Soft, non-tender, non-distended. Bowel sounds present. No  organomegaly or mass.  EXTREMITIES: No pedal edema, cyanosis, or clubbing.  NEUROLOGIC: Cranial nerves II through XII are intact. Muscle strength 5/5 in all extremities. Sensation intact. Gait not checked.  PSYCHIATRIC: The patient is alert and oriented x 3.  SKIN: No obvious rash, lesion, or ulcer.  DATA REVIEW:   CBC Recent Labs  Lab 04/26/19 0401  WBC 8.3  HGB 9.4*  HCT 28.9*  PLT 158    Chemistries   Recent Labs  Lab 04/24/19 1130  04/27/19 0448  NA 141   < > 139  K 3.3*   < > 3.3*  CL 114*   < > 118*  CO2 20*   < > 18*  GLUCOSE 96   < > 87  BUN 17   < > 14  CREATININE 0.94   < > 0.83  CALCIUM 9.6   < > 8.6*  MG  --    < > 2.1  AST 22  --   --   ALT 18  --   --   ALKPHOS 57  --   --   BILITOT 0.8  --   --    < > = values in this interval not displayed.     Microbiology Results  Results for orders placed or performed during the hospital encounter of 04/24/19  SARS CORONAVIRUS 2 (TAT 6-24 HRS) Nasopharyngeal Nasopharyngeal Swab     Status: None   Collection Time: 04/24/19  1:26 PM   Specimen: Nasopharyngeal Swab  Result Value Ref Range Status   SARS Coronavirus 2 NEGATIVE NEGATIVE Final    Comment: (NOTE) SARS-CoV-2 target nucleic acids are NOT DETECTED. The SARS-CoV-2 RNA is generally detectable in upper and lower respiratory specimens during the acute phase of infection. Negative results do not preclude SARS-CoV-2 infection, do not rule out co-infections with other pathogens, and should not be used as the sole basis for treatment or other patient management decisions. Negative results must be combined with clinical observations, patient history, and epidemiological information. The expected result is Negative. Fact Sheet for Patients: SugarRoll.be Fact Sheet for Healthcare Providers: https://www.woods-mathews.com/ This test is not yet approved or cleared by the [place] FDA and  has been authorized for detection and/or diagnosis of SARS-CoV-2 by FDA under an Emergency Use Authorization (EUA). This EUA will remain  in effect (meaning this test can be used) for the duration of the COVID-19 declaration under Section 56 4(b)(1) of the Act, 21 U.S.C. section 360bbb-3(b)(1), unless the authorization is terminated or revoked sooner. Performed at [facility], [place] [address], [place], [place] [address]      RADIOLOGY:  No results found.   Management plans discussed with the patient, family and they are in agreement.  CODE STATUS: Full Code   TOTAL TIME TAKING CARE OF THIS PATIENT: 35 minutes.    Eesa Justiss M.D on 04/27/2019 at 12:29 PM  Between 7am to 6pm - Pager - [phone]  After 6pm go to [email or web address] - Proofreader  [facility]  Office  [phone]  CC: Primary care physician; Letta Median, MD   Note: This dictation was prepared with Dragon dictation along with smaller phrase technology. Any transcriptional errors that result from this process are unintentional.

## 2019-04-27 NOTE — Progress Notes (Signed)
Removed IV before patients discharge.

## 2019-04-27 NOTE — TOC Transition Note (Signed)
Transition of Care [facility]) - CM/SW Discharge Note   Patient Details  Name: Shalay Carder MRN: [id number] Date of Birth: 10/26/1945  [facility] CM/SW Contact:  Shelbie Hutching, RN Phone Number: 04/27/2019, 1:58 PM   Clinical Narrative:     Discharge home with son.  Referral for OP OT faxed to [facility] outpatient rehab.   Final next level of care: OP Rehab Barriers to Discharge: No Barriers Identified   Patient Goals and CMS Choice        Discharge Placement                       Discharge Plan and Services                                     Social Determinants of Health (SDOH) Interventions     Readmission Risk Interventions No flowsheet data found.

## 2019-04-27 NOTE — Progress Notes (Addendum)
Patient is A&Ox4,  Patient states when she looks at the light, the light color is purple. Will continue to monitor

## 2019-04-28 ENCOUNTER — Telehealth: Payer: Self-pay | Admitting: Internal Medicine

## 2019-04-28 LAB — BETA-2-GLYCOPROTEIN I ABS, IGG/M/A
Beta-2 Glyco I IgG: <9 GPI IgG units (ref 0–20)
Beta-2-Glycoprotein I IgA: 14 GPI IgA units (ref 0–25)
Beta-2-Glycoprotein I IgM: <9 GPI IgM units (ref 0–32)

## 2019-04-28 LAB — LUPUS ANTICOAGULANT PANEL
DRVVT: 32.4 s (ref 0.0–47.0)
PTT Lupus Anticoagulant: 49.2 s (ref 0.0–51.9)

## 2019-04-28 NOTE — Telephone Encounter (Signed)
Colette- Please have the patient follow-up with me in approximately 3 weeks/Doximity; Dx-hospital follow-up/hypercoagulable state.  GB  FYI-Dr.Corcoran.

## 2019-04-29 LAB — CARDIOLIPIN ANTIBODIES, IGG, IGM, IGA
Anticardiolipin IgA: <9 APL U/mL (ref 0–11)
Anticardiolipin IgG: <9 GPL U/mL (ref 0–14)
Anticardiolipin IgM: 9 MPL U/mL (ref 0–12)

## 2019-04-30 LAB — FACTOR 5 LEIDEN

## 2019-05-01 LAB — PROTHROMBIN GENE MUTATION

## 2019-05-05 ENCOUNTER — Inpatient Hospital Stay: Payer: Medicaid Other | Admitting: Hematology and Oncology

## 2019-05-05 LAB — PNH PROFILE (-HIGH SENSITIVITY)

## 2019-05-08 ENCOUNTER — Inpatient Hospital Stay: Payer: Medicaid Other | Admitting: Hematology and Oncology

## 2019-05-12 NOTE — Progress Notes (Signed)
[facility]  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  She states that she has had 2-3 surgeries in the left eye and had had issues with infection.  She describes "1 to 2 minutes of dark vision" in the right eye on Thursday, 04/23/2019.  She had a similar episode in 11/2018 with decreased visual acuity and no acute funduscopic changes.  Head CT on 11/24/2018 was unremarkable.  She was admitted to [facility] from 04/24/2019 - 04/27/2019 for loss of vision. She was sent from ophthalmology clinic to ER. She denied any focal numbness and weakness, no headache, dizziness or incontinence.   ER physician requested admission to rule out CVA. Patient was seen by neurology who suggested obtaining a hematology evaluation. She continued aspirin and statins. She was to follow up with ophthalmologist Dr. Murvin Natal in 2 weeks following discharge.   CXR on 04/24/2019 revealed no acute intracranial process. Head MRI without contrast on 04/24/2019 revealed no acute intracranial abnormalities. There was extensive white matter changes which were chronic and stable compared with 11/25/2018 MRI. Findings were most compatible with chronic microvascular ischemia.   Head MRI with contrast on 04/25/2019 revealed no abnormal enhancement of the brain or leptomeninges. No evidence of vascular occlusion. Moderate chronic appearing small vessel changes of the brain demonstrated better on yesterday's full exam.  Head MRI angiogram on 04/25/2019 showed no large or medium vessel occlusion or correctable proximal stenosis. There was some tortuosity and irregularity with mild  ectasia of the distal cervical ICA on the left suggesting underlying fibromuscular disease. There was no evidence of stenosis or pseudo aneurysm. Head MRV on 04/25/2019 showed a negative intracranial MR venography.   She had been followed by Dr. Rogue Bussing (10/17/2015 - 11/10/2015). She was last seen in the hematology clinic on 11/10/2015 by Dr. Rogue Bussing. At that time, she had chronic fatigue, mild shortness of breath and left should pain. Her weight was down 10 pounds. She denied any abdominal pain, blood in stool, and melena. Hemoglobin was 10.6 (baseline).    Hemoglobin electrophoresis on 10/17/2015 revealed 94.2% hemoglobin variant and 5.8% hemoglobin A2 the pattern consistent with homozygous E disease.  Review of CBCs dating back to 08/15/2015 revealed a hemoglobin typically ranging between 10.8 -11.4.  RBCs are microcytic with an MCV 63.0 - 63.7.  She may have had a transfusion in the past.  She has never been on oral chelation therapy.      CRP was < 0.8 and sed rate 24 on 04/25/2019.    Work up on 04/27/2019 included ferritin 297 with iron saturation 41% and TIBC of 203. PNH profile showed no evidence of paroxysmal nocturnal hemoglobinuria. No lupus anticoagulation was detected. Anticardiolipin antibodies were negative (IgG <9, IgM 9, IgA <9). Beta-2-Glycoprotein (IgG <9, IgM <9, IgA 14) was negative. Factor 5 Leiden was negative. Prothrombin gene mutation was negative.   Symptomatically, she feels "worried about my eyesight".  Since discharge, she has had intermittent blurred vision. She has shooting pain in her left eye that radiates to the top of her head. Her son notes her vision is still not good. She has limited vision in her  right eye. She had a follow up with her eye doctor on 05/12/2019. They noted good eye pressure and she continued eye drops. She will have a follow up with the ophthalmologist in 05/2019. She will have her eye shot with Dr. Maple Hudson soon.   Her son notes that her BPwas  130/70.  Patient would like to seen by me in the clinic as needed. Patients son agrees.    Past Medical History:  Diagnosis Date  . GERD (gastroesophageal reflux disease)   . Glaucoma   . Hypertension   . Osteoporosis   . Vitamin D deficiency     Past Surgical History:  Procedure Laterality Date  . ABDOMINAL HYSTERECTOMY    . CATARACT EXTRACTION W/ INTRAOCULAR LENS IMPLANT    . PHOTOCOAGULATION WITH LASER Left 08/27/2018   Procedure: TDC TRANSCLERAL DIODE CYCLOPHOTOCOAGULATION;  Surgeon: Lockie Mola, MD;  [facility];  Service: Ophthalmology;  Laterality: Left;    Family History  Family history unknown: Yes    Social History:  reports that she has never smoked. She has never used smokeless tobacco. She reports previous alcohol use. She reports that she does not use drugs. She moved to the [place] in 1987. The patient is accompanied by her son [name], and Despina Arias - [employer] interpreter [phone]) today.  Allergies: No Known Allergies  Current Medications: Current Outpatient Medications  Medication Sig Dispense Refill  . acetaZOLAMIDE (DIAMOX) 500 MG capsule Take 500 mg by mouth 2 (two) times daily.    Marland Kitchen aspirin 325 MG tablet Take 1 tablet (325 mg total) by mouth daily. 30 tablet 0  . atorvastatin (LIPITOR) 40 MG tablet Take 1 tablet (40 mg total) by mouth daily at 6 PM. 30 tablet 0  . brimonidine (ALPHAGAN) 0.2 % ophthalmic solution Place 1 drop into both eyes 2 (two) times daily.   0  . Calcium Carbonate-Vitamin D (CALCIUM 600+D) 600-400 MG-UNIT tablet Take 1 tablet by mouth 2 (two) times daily.    . dorzolamide-timolol (COSOPT) 22.3-6.8 MG/ML ophthalmic solution Place 1 drop into both eyes 2 (two) times daily.   0  . latanoprost (XALATAN) 0.005 % ophthalmic solution Place 1 drop into both eyes at bedtime.    . pantoprazole (PROTONIX) 40 MG tablet Take 40 mg by mouth daily.   0   No current facility-administered medications for this visit.      Review of Systems  Constitutional: Negative.  Negative for chills, diaphoresis, malaise/fatigue and weight loss.       Feels "good".  HENT: Negative.  Negative for congestion, ear pain, hearing loss, nosebleeds, sinus pain, sore throat and tinnitus.   Eyes: Positive for blurred vision (intermittent ). Negative for double vision and photophobia.       Limited vision in right eye. "Shooting" pain in left eye radiating to head.  Respiratory: Negative.  Negative for cough, hemoptysis, sputum production and shortness of breath.   Cardiovascular: Negative.  Negative for chest pain, palpitations, claudication, leg swelling and PND.  Gastrointestinal: Negative.  Negative for abdominal pain, blood in stool, constipation, diarrhea, heartburn, melena, nausea and vomiting.  Genitourinary: Negative.  Negative for dysuria, frequency, hematuria and urgency.  Musculoskeletal: Negative.  Negative for back pain, joint pain, myalgias and neck pain.  Skin: Negative.  Negative for itching and rash.  Neurological: Negative.  Negative for dizziness, tingling, tremors, sensory change, speech change, focal weakness, weakness and headaches.  Endo/Heme/Allergies: Negative.  Does not bruise/bleed easily.  Psychiatric/Behavioral: Negative.  Negative for depression and memory  loss. The patient is not nervous/anxious and does not have insomnia.   All other systems reviewed and are negative.  Performance status (ECOG): 1  Vitals Temperature (!) 96 F (35.6 C), temperature source Tympanic, resp. rate 18, height 5' (1.524 m), weight 110 lb 3.7 oz (50 kg).  Physical Exam  Constitutional: She is oriented to person, place, and time. She appears well-developed and well-nourished. No distress.  HENT:  Head: Normocephalic and atraumatic.  Mouth/Throat: Oropharynx is clear and moist. No oropharyngeal exudate.  Short black/gray hair.  Mask.  Eyes: Pupils are equal, round, and reactive to light. Conjunctivae and EOM are normal.  No scleral icterus.  Left eye cloudy.  Right eye brown and reactive.  Neck: Normal range of motion. Neck supple. No JVD present.  Cardiovascular: Normal rate, regular rhythm and normal heart sounds.  No murmur heard. Pulmonary/Chest: Effort normal and breath sounds normal. No respiratory distress. She has no wheezes. She has no rales. She exhibits no tenderness.  Abdominal: Soft. Bowel sounds are normal. She exhibits no distension and no mass. There is no abdominal tenderness. There is no rebound and no guarding.  Musculoskeletal: Normal range of motion.        General: No tenderness or edema.  Lymphadenopathy:       Head (right side): No preauricular, no posterior auricular and no occipital adenopathy present.       Head (left side): No preauricular, no posterior auricular and no occipital adenopathy present.    She has no cervical adenopathy.    She has no axillary adenopathy.       Right: No inguinal and no supraclavicular adenopathy present.       Left: No inguinal and no supraclavicular adenopathy present.  Neurological: She is alert and oriented to person, place, and time.  Skin: Skin is warm and dry. No rash noted. She is not diaphoretic. No erythema. No pallor.  Psychiatric: She has a normal mood and affect. Her behavior is normal. Judgment and thought content normal.  Nursing note and vitals reviewed.   No visits with results within 3 Day(s) from this visit.  Latest known visit with results is:  Admission on 04/24/2019, Discharged on 04/27/2019  Component Date Value Ref Range Status  . Prothrombin Time 04/24/2019 13.4  11.4 - 15.2 seconds Final  . INR 04/24/2019 1.0  0.8 - 1.2 Final   Comment: (NOTE) INR goal varies based on device and disease states. Performed at [facility], [address], [place], [place] [address]   . aPTT 04/24/2019 36  24 - 36 seconds Final   Performed at [facility], [address] [place], [place] [address]  . WBC 04/24/2019  6.3  4.0 - 10.5 K/uL Final  . RBC 04/24/2019 5.46* 3.87 - 5.11 MIL/uL Final  . Hemoglobin 04/24/2019 11.3* 12.0 - 15.0 g/dL Final  . HCT 09/81/1914 34.4* 36.0 - 46.0 % Final  . MCV 04/24/2019 63.0* 80.0 - 100.0 fL Final  . MCH 04/24/2019 20.7* 26.0 - 34.0 pg Final  . MCHC 04/24/2019 32.8  30.0 - 36.0 g/dL Final  . RDW 78/29/5621 15.6* 11.5 - 15.5 % Final  . Platelets 04/24/2019 181  150 - 400 K/uL Final  . nRBC 04/24/2019 0.0  0.0 - 0.2 % Final   Performed at [facility], [address], [place], [place] [address]  . Neutrophils Relative % 04/24/2019 69  % Final  . Neutro Abs 04/24/2019 4.4  1.7 - 7.7 K/uL Final  .  Lymphocytes Relative 04/24/2019 20  % Final  . Lymphs Abs 04/24/2019 1.3  0.7 - 4.0 K/uL Final  . Monocytes Relative 04/24/2019 8  % Final  . Monocytes Absolute 04/24/2019 0.5  0.1 - 1.0 K/uL Final  . Eosinophils Relative 04/24/2019 2  % Final  . Eosinophils Absolute 04/24/2019 0.1  0.0 - 0.5 K/uL Final  . Basophils Relative 04/24/2019 1  % Final  . Basophils Absolute 04/24/2019 0.0  0.0 - 0.1 K/uL Final  . WBC Morphology 04/24/2019 MORPHOLOGY UNREMARKABLE   Final  . Smear Review 04/24/2019 Normal platelet morphology   Final  . Immature Granulocytes 04/24/2019 0  % Final  . Abs Immature Granulocytes 04/24/2019 0.01  0.00 - 0.07 K/uL Final  . Target Cells 04/24/2019 PRESENT   Final   Performed at [facility], [address] [place], [place] [address]  . Sodium 04/24/2019 141  135 - 145 mmol/L Final  . Potassium 04/24/2019 3.3* 3.5 - 5.1 mmol/L Final  . Chloride 04/24/2019 114* 98 - 111 mmol/L Final  . CO2 04/24/2019 20* 22 - 32 mmol/L Final  . Glucose, Bld 04/24/2019 96  70 - 99 mg/dL Final  . BUN 60/45/4098 17  8 - 23 mg/dL Final  . Creatinine, Ser 04/24/2019 0.94  0.44 - 1.00 mg/dL Final  . Calcium 11/91/4782 9.6  8.9 - 10.3 mg/dL Final  . Total Protein 04/24/2019 8.5* 6.5 - 8.1 g/dL Final  . Albumin 95/62/1308 4.5  3.5 - 5.0 g/dL Final  . AST  65/78/4696 22  15 - 41 U/L Final  . ALT 04/24/2019 18  0 - 44 U/L Final  . Alkaline Phosphatase 04/24/2019 57  38 - 126 U/L Final  . Total Bilirubin 04/24/2019 0.8  0.3 - 1.2 mg/dL Final  . GFR calc non Af Amer 04/24/2019 >60  >60 mL/min Final  . GFR calc Af Amer 04/24/2019 >60  >60 mL/min Final  . Anion gap 04/24/2019 7  5 - 15 Final   Performed at [facility], [address], [place], [place] [address]  . SARS Coronavirus 2 04/24/2019 NEGATIVE  NEGATIVE Final   Comment: (NOTE) SARS-CoV-2 target nucleic acids are NOT DETECTED. The SARS-CoV-2 RNA is generally detectable in upper and lower respiratory specimens during the acute phase of infection. Negative results do not preclude SARS-CoV-2 infection, do not rule out co-infections with other pathogens, and should not be used as the sole basis for treatment or other patient management decisions. Negative results must be combined with clinical observations, patient history, and epidemiological information. The expected result is Negative. Fact Sheet for Patients: HairSlick.no Fact Sheet for Healthcare Providers: quierodirigir.com This test is not yet approved or cleared by the [place] FDA and  has been authorized for detection and/or diagnosis of SARS-CoV-2 by FDA under an Emergency Use Authorization (EUA). This EUA will remain  in effect (meaning this test can be used) for the duration of the COVID-19 declaration under Section 56                          4(b)(1) of the Act, 21 U.S.C. section 360bbb-3(b)(1), unless the authorization is terminated or revoked sooner. Performed at [facility], [address], [place], [place] [address]   . WBC 04/24/2019 7.0  4.0 - 10.5 K/uL Final  . RBC 04/24/2019 5.18* 3.87 - 5.11 MIL/uL Final  . Hemoglobin 04/24/2019 10.8* 12.0 - 15.0 g/dL Final  . HCT 40/04/2724 32.8* 36.0 -  46.0 % Final  . MCV 04/24/2019 63.3* 80.0 -  100.0 fL Final  . MCH 04/24/2019 20.8* 26.0 - 34.0 pg Final  . MCHC 04/24/2019 32.9  30.0 - 36.0 g/dL Final  . RDW 32/44/0102 15.9* 11.5 - 15.5 % Final  . Platelets 04/24/2019 179  150 - 400 K/uL Final  . nRBC 04/24/2019 0.0  0.0 - 0.2 % Final   Performed at [facility], [address] [place], [place] [address]  . Magnesium 04/24/2019 2.1  1.7 - 2.4 mg/dL Final   Performed at [facility], [address] [place] [place], [place] [address]  . Hgb A1c MFr Bld 04/25/2019 5.2  4.8 - 5.6 % Final   Comment: (NOTE)         Prediabetes: 5.7 - 6.4         Diabetes: >6.4         Glycemic control for adults with diabetes: <7.0   . Mean Plasma Glucose 04/25/2019 103  mg/dL Final   Comment: (NOTE) Performed At: [facility] [address] [place], [place] [id number] Jolene Schimke MD OV:[id number]   . Cholesterol 04/25/2019 135  0 - 200 mg/dL Final  . Triglycerides 04/25/2019 45  <150 mg/dL Final  . HDL 84/16/6063 73  >40 mg/dL Final  . Total CHOL/HDL Ratio 04/25/2019 1.8  RATIO Final  . VLDL 04/25/2019 9  0 - 40 mg/dL Final  . LDL Cholesterol 04/25/2019 53  0 - 99 mg/dL Final   Comment:        Total Cholesterol/HDL:CHD Risk Coronary Heart Disease Risk Table                     Men   Women  1/2 Average Risk   3.4   3.3  Average Risk       5.0   4.4  2 X Average Risk   9.6   7.1  3 X Average Risk  23.4   11.0        Use the calculated Patient Ratio above and the CHD Risk Table to determine the patient's CHD Risk.        ATP III CLASSIFICATION (LDL):  <100     mg/dL   Optimal  016-010  mg/dL   Near or Above                    Optimal  130-159  mg/dL   Borderline  932-355  mg/dL   High  >732     mg/dL   Very High Performed at [facility], [address] [place], [place] [address]   . Sodium 04/25/2019 141  135 - 145 mmol/L Final  . Potassium 04/25/2019 3.4* 3.5 - 5.1 mmol/L Final  . Chloride 04/25/2019 117* 98 - 111 mmol/L Final  . CO2  04/25/2019 17* 22 - 32 mmol/L Final  . Glucose, Bld 04/25/2019 84  70 - 99 mg/dL Final  . BUN 27/12/2374 16  8 - 23 mg/dL Final  . Creatinine, Ser 04/25/2019 1.09* 0.44 - 1.00 mg/dL Final  . Calcium 28/31/5176 8.9  8.9 - 10.3 mg/dL Final  . GFR calc non Af Amer 04/25/2019 51* >60 mL/min Final  . GFR calc Af Amer 04/25/2019 59* >60 mL/min Final  . Anion gap 04/25/2019 7  5 - 15 Final   Performed at [facility], [address], [place], [place] [address]  . Sed Rate 04/25/2019 24  0 - 30 mm/hr Final  Performed at [facility], [address] [place], [place] [address]  . CRP 04/25/2019 <0.8  <1.0 mg/dL Final   Performed at [facility], [address] [place], [place] [address]  . Sodium 04/26/2019 141  135 - 145 mmol/L Final  . Potassium 04/26/2019 3.1* 3.5 - 5.1 mmol/L Final  . Chloride 04/26/2019 119* 98 - 111 mmol/L Final  . CO2 04/26/2019 15* 22 - 32 mmol/L Final  . Glucose, Bld 04/26/2019 91  70 - 99 mg/dL Final  . BUN 09/81/191410/05/2019 17  8 - 23 mg/dL Final  . Creatinine, Ser 04/26/2019 0.79  0.44 - 1.00 mg/dL Final  . Calcium 78/29/562110/05/2019 8.5* 8.9 - 10.3 mg/dL Final  . GFR calc non Af Amer 04/26/2019 >60  >60 mL/min Final  . GFR calc Af Amer 04/26/2019 >60  >60 mL/min Final  . Anion gap 04/26/2019 7  5 - 15 Final   Performed at [facility], [address] [place], [place] [address]  . WBC 04/26/2019 8.3  4.0 - 10.5 K/uL Final  . RBC 04/26/2019 4.54  3.87 - 5.11 MIL/uL Final  . Hemoglobin 04/26/2019 9.4* 12.0 - 15.0 g/dL Final  . HCT 78/46/962910/05/2019 28.9* 36.0 - 46.0 % Final  . MCV 04/26/2019 63.7* 80.0 - 100.0 fL Final  . MCH 04/26/2019 20.7* 26.0 - 34.0 pg Final  . MCHC 04/26/2019 32.5  30.0 - 36.0 g/dL Final  . RDW 52/84/132410/05/2019 15.7* 11.5 - 15.5 % Final  . Platelets 04/26/2019 158  150 - 400 K/uL Final  . nRBC 04/26/2019 0.0  0.0 - 0.2 % Final   Performed at [facility], [address] [place], [place] [address]  . Magnesium  04/26/2019 1.9  1.7 - 2.4 mg/dL Final   Performed at [facility], [address] [place], [place] [address]  . Sodium 04/27/2019 139  135 - 145 mmol/L Final  . Potassium 04/27/2019 3.3* 3.5 - 5.1 mmol/L Final  . Chloride 04/27/2019 118* 98 - 111 mmol/L Final  . CO2 04/27/2019 18* 22 - 32 mmol/L Final  . Glucose, Bld 04/27/2019 87  70 - 99 mg/dL Final  . BUN 64/40/347410/06/2019 14  8 - 23 mg/dL Final  . Creatinine, Ser 04/27/2019 0.83  0.44 - 1.00 mg/dL Final  . Calcium 25/95/638710/06/2019 8.6* 8.9 - 10.3 mg/dL Final  . GFR calc non Af Amer 04/27/2019 >60  >60 mL/min Final  . GFR calc Af Amer 04/27/2019 >60  >60 mL/min Final  . Anion gap 04/27/2019 3* 5 - 15 Final   Performed at [facility], [address] [place], [place] [address]  . Magnesium 04/27/2019 2.1  1.7 - 2.4 mg/dL Final   Performed at [facility], [address] [place], [place] [address]  . Ferritin 04/27/2019 297  11 - 307 ng/mL Final   Performed at [facility], [address] [place], [place] [address]  . Iron 04/27/2019 83  28 - 170 ug/dL Final  . TIBC 06/30/160110/06/2019 203* 250 - 450 ug/dL Final  . Saturation Ratios 04/27/2019 41* 10.4 - 31.8 % Final  . UIBC 04/27/2019 120  ug/dL Final   Performed at [facility], [address] [place], [place] [address]  . Interpretation 04/27/2019 Comment   Final   Comment: (NOTE) Peripheral Blood: No evidence of paroxysmal nocturnal hemoglobinuria (PNH) DISCLAIMER: REFER TO HARDCOPY OR PDF FOR COMPLETE RESULT. If synopsis provided, clinical decisions should not be based on this interfaced synopsis alone.  Performed At: ;# [facility] [address] [place], [place] [id number] Barron Alvine MD WJ:[id number]   . PTT Lupus Anticoagulant 04/27/2019 49.2  0.0 - 51.9 sec Final  . DRVVT 04/27/2019 32.4  0.0 - 47.0 sec Final  . Lupus Anticoag Interp 04/27/2019 Comment:   Corrected   Comment: (NOTE) No lupus anticoagulant  was detected. Performed At: [facility] [address] [place], [place] [id number] Jolene Schimke MD IO:[id number]   . Anticardiolipin IgG 04/27/2019 <9  0 - 14 GPL U/mL Final   Comment: (NOTE)                          Negative:              <15                          Indeterminate:     15 - 20                          Low-Med Positive: >20 - 80                          High Positive:         >80   . Anticardiolipin IgM 04/27/2019 9  0 - 12 MPL U/mL Final   Comment: (NOTE)                          Negative:              <13                          Indeterminate:     13 - 20                          Low-Med Positive: >20 - 80                          High Positive:         >80   . Anticardiolipin IgA 04/27/2019 <9  0 - 11 APL U/mL Final   Comment: (NOTE)                          Negative:              <12                          Indeterminate:     12 - 20                          Low-Med Positive: >20 - 80                          High Positive:         >80 Performed At: [facility] [address] [place], [place] [id number] Jolene Schimke MD ZD:[id number]   . Beta-2 Glyco I IgG 04/27/2019 <9  0 - 20 GPI IgG units Final   Comment: (NOTE) The reference interval reflects a 3SD or 99th percentile interval, which is thought  to represent a potentially clinically significant result in accordance with the International Consensus Statement on the classification criteria for definitive antiphospholipid syndrome (APS). J Thromb Haem 2006;4:295-306.   . Beta-2-Glycoprotein I IgM 04/27/2019 <9  0 - 32 GPI IgM units Final   Comment: (NOTE) The reference interval reflects a 3SD or 99th percentile interval, which is thought to represent a potentially clinically significant result in accordance with the International Consensus Statement on the classification criteria for definitive antiphospholipid syndrome (APS). J Thromb Haem 2006;4:295-306. Performed At: [facility] [address] [place], [place] [id number] Jolene Schimke MD WU:[id number]   . Beta-2-Glycoprotein I IgA 04/27/2019 14  0 - 25 GPI IgA units Final   Comment: (NOTE) The reference interval reflects a 3SD or 99th percentile interval, which is thought to represent a potentially clinically significant result in accordance with the International Consensus Statement on the classification criteria for definitive antiphospholipid syndrome (APS). J Thromb Haem 2006;4:295-306.   Marland Kitchen Recommendations-F5LEID: 04/27/2019 Comment   Final   Comment: (NOTE) Result:  Negative (no mutation found) Factor V Leiden is a specific mutation ([id number]) in the factor V gene that is associated with an increased risk of venous thrombosis. Factor V Leiden is more resistant to inactivation by activated protein C.  As a result, factor V persists in the circulation leading to a mild hyper- coagulable state.  The Leiden mutation accounts for 90% - 95% of APC resistance.  Factor V Leiden has been reported in patients with deep vein thrombosis, pulmonary embolus, central retinal vein occlusion, cerebral sinus thrombosis and hepatic vein thrombosis. Other risk factors to be considered in the workup for venous thrombosis include the [id number] mutation in the factor II (prothrombin) gene, protein S and C deficiency, and antithrombin deficiencies. Anticardiolipin antibody and lupus anticoagulant analysis may be appropriate for certain patients, as well as homocysteine levels. Contact your local [facility] for information on how to order additi                          onal testing if desired. **Genetic counselors are available for health care providers to**  discuss results at [phone]). Methodology: DNA analysis of the Factor V gene was performed by allele-specific PCR. The diagnostic sensitivity and specificity is >99% for both. Molecular-based testing is highly accurate, but as in any laboratory test,  diagnostic errors may occur. All test results must be combined with clinical information for the most accurate interpretation. This test was developed and its performance characteristics determined by [facility]. It has not been cleared or approved by the Food and Drug Administration. References: Voelkerding K (1996).  Clin Lab Med [phone]. Vincenza Hews, PhD, [facility] Ernestene Mention, PhD, [facility] Wyatt Portela, M.S., PhD, [facility] Manya Silvas, PhD, [facility] Bonnielee Haff, PhD, [facility] Alpha Gula PhD, [facility] Performed At: [facility] [address] [place], [place] [id number] Maurine Simmering MDPhD UX:[id number]   . Recommendations-PTGENE: 04/27/2019 Comment   Final   Comment: (NOTE) NEGATIVE No mutation identified. Comment: A point mutation ([id number]) in the factor II (prothrombin) gene is the second most common cause of inherited thrombophilia. The incidence of this mutation in the [place] Caucasian population is about 2% and in the [place] American population it is approximately 0.5%. This mutation is rare in the [place] and Native American population. Being heterozygous for a prothrombin mutation increases the risk for developing venous thrombosis about 2 to 3 times above the general population risk. Being homozygous for  the prothrombin gene mutation increases the relative risk for venous thrombosis further, although it is not yet known how much further the risk is increased. In women heterozygous for the prothrombin gene mutation, the use of estrogen containing oral contraceptives increases the relative risk of venous thrombosis about 16 times and the risk of developing cerebral thrombosis is also significantly increased. In pregnancy the pr                          othrombin gene mutation increases risk for venous thrombosis and may increase risk for stillbirth, placental abruption, pre-eclampsia and fetal growth restriction. If the patient possesses two or more congenital or  acquired thrombophilic risk factors, the risk for thrombosis may rise to more than the sum of the risk ratios for the individual mutations. This assay detects only the prothrombin G20210A mutation and does not measure genetic abnormalities elsewhere in the genome. Other thrombotic risk factors may be pursued through systematic clinical laboratory analysis. These factors include the R506Q (Leiden) mutation in the Factor V gene, plasma homocysteine levels, as well as testing for deficiencies of antithrombin III, protein C and protein S. Genetic Counselors are available for health care providers to discuss results at [phone]). Methodology: DNA analysis of the Factor II gene was performed by PCR amplification followed by restriction analysis. The di                          agnostic sensitivity is >99% for both. All the tests must be combined with clinical information for the most accurate interpretation. Molecular-based testing is highly accurate, but as in any laboratory test, diagnostic errors may occur. This test was developed and its performance characteristics determined by [facility]. It has not been cleared or approved by the Food and Drug Administration. Poort SR, et al. Blood. 1996; 96:0454-0981. Varga EA. Circulation. 2004; 110:e15-e18. Marland Kitchen, et al. Arterioscler Thromb Vasc Biol. [phone]. 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  MRA contrast on 04/25/2019 revealed no large or medium vessel occlusion or correctable proximal stenosis.  There was some tortuosity and irregularity with mild ectasia of the distal cervical ICA on the left suggesting underlying fibromuscular disease.  There was no stenosis or pseudoaneurysm.  Head MRI without contrast revealed no acute intracranial abnormality and extensive white matter changes chronic (stable compared to 11/25/2018).  Head MRI with contrast revealed no abnormal enhancement of the brain or leptomeninges.  There was no vascular occlusion.  Head MRV revealed negative an MRI venography.    CRP was < 0.8 and sed rate 24 on 04/25/2019.    Work up on 04/27/2019 included ferritin 297 with iron saturation 41% and TIBC of 203. PNH profile revealed no evidence of paroxysmal nocturnal hemoglobinuria. Normal studies included: lupus anticoagulant  panel,  anticardiolipin antibodies, beta-2-glycoprotein, Factor 5 Leiden, and prothrombin gene mutation.  Hemoglobin electrophoresis on 10/17/2015 revealed 94.2% hemoglobin variant and 5.8% hemoglobin A2 the pattern consistent with homozygous E disease.  Review of CBCs dating back to 08/15/2015 revealed a hemoglobin typically ranging between 10.8 -11.4.  RBCs are microcytic with an MCV 63.0 - 63.7.  Symptomatically, she notes intermittent blurry vision in her right eye.  Exam is stable.  Plan: 1.  Hemoglobin E disease             She has a mutation in the beta globulin chain.             RBCs are hypochromic, microcytic with some target cells.             Clinically, there is no reason for hemoglobin E patients to have loss of vision. 2.   Transient loss of right sided vision             Etiology remains unclear.             No current evidence of vasculitis with MRI/MRA and normal CRP/sed rate.  Hypercoagulable  work-up was negative.  Follow-up with ophthalmology. 3.   RTC prn.  I discussed the assessment and treatment plan with the patient.  The patient was provided an opportunity to ask questions and all were answered.  The patient agreed with the plan and demonstrated an understanding of the instructions.  The patient was advised to call back if the symptoms worsen or if the condition fails to improve as anticipated.  I provided 15 minutes of face-to-face time during this this encounter and > 50% was spent counseling as documented under my assessment and plan.     C. Merlene Pulling, MD, PhD    05/14/2019, 11:42 AM  I, Theador Hawthorne, am acting as scribe for [employer]. Merlene Pulling, MD, PhD.  I,  C. Merlene Pulling, MD, have reviewed the above documentation for accuracy and completeness, and I agree with the above.

## 2019-05-13 ENCOUNTER — Other Ambulatory Visit: Payer: Self-pay

## 2019-05-14 ENCOUNTER — Inpatient Hospital Stay: Payer: Medicaid Other | Attending: Hematology and Oncology | Admitting: Hematology and Oncology

## 2019-05-14 ENCOUNTER — Encounter: Payer: Self-pay | Admitting: Hematology and Oncology

## 2019-05-14 VITALS — Temp 96.0°F | Resp 18 | Ht 60.0 in | Wt 110.2 lb

## 2019-05-14 DIAGNOSIS — H5462 Unqualified visual loss, left eye, normal vision right eye: Secondary | ICD-10-CM

## 2019-05-14 DIAGNOSIS — D582 Other hemoglobinopathies: Secondary | ICD-10-CM

## 2019-05-14 DIAGNOSIS — H53121 Transient visual loss, right eye: Secondary | ICD-10-CM | POA: Diagnosis not present

## 2019-05-14 NOTE — Progress Notes (Signed)
[place] interpreter utilized for today's visit [phone])

## 2019-08-19 ENCOUNTER — Other Ambulatory Visit: Payer: Self-pay | Admitting: Primary Care

## 2019-08-19 DIAGNOSIS — Z1231 Encounter for screening mammogram for malignant neoplasm of breast: Secondary | ICD-10-CM

## 2019-10-08 ENCOUNTER — Ambulatory Visit
Admission: RE | Admit: 2019-10-08 | Discharge: 2019-10-08 | Disposition: A | Discharge status: Home / Self Care | Payer: Medicaid Other | Source: Ambulatory Visit | Attending: Primary Care | Admitting: Primary Care

## 2019-10-08 DIAGNOSIS — Z1231 Encounter for screening mammogram for malignant neoplasm of breast: Secondary | ICD-10-CM

## 2020-10-18 ENCOUNTER — Other Ambulatory Visit: Payer: Self-pay | Admitting: Primary Care

## 2020-10-18 DIAGNOSIS — Z1231 Encounter for screening mammogram for malignant neoplasm of breast: Secondary | ICD-10-CM

## 2020-10-27 ENCOUNTER — Ambulatory Visit
Admission: RE | Admit: 2020-10-27 | Discharge: 2020-10-27 | Disposition: A | Discharge status: Home / Self Care | Payer: Medicaid Other | Source: Ambulatory Visit | Attending: Primary Care | Admitting: Primary Care

## 2020-10-27 ENCOUNTER — Other Ambulatory Visit: Payer: Self-pay

## 2020-10-27 DIAGNOSIS — Z1231 Encounter for screening mammogram for malignant neoplasm of breast: Secondary | ICD-10-CM | POA: Insufficient documentation

## 2021-10-24 ENCOUNTER — Other Ambulatory Visit: Payer: Self-pay | Admitting: Primary Care

## 2021-10-24 DIAGNOSIS — Z1231 Encounter for screening mammogram for malignant neoplasm of breast: Secondary | ICD-10-CM

## 2021-12-05 ENCOUNTER — Ambulatory Visit
Admission: RE | Admit: 2021-12-05 | Discharge: 2021-12-05 | Disposition: A | Discharge status: Home / Self Care | Payer: Medicaid Other | Source: Ambulatory Visit | Attending: Primary Care | Admitting: Primary Care

## 2021-12-05 DIAGNOSIS — Z1231 Encounter for screening mammogram for malignant neoplasm of breast: Secondary | ICD-10-CM | POA: Insufficient documentation

## 2021-12-08 ENCOUNTER — Other Ambulatory Visit: Payer: Self-pay | Admitting: Primary Care

## 2021-12-08 DIAGNOSIS — N6489 Other specified disorders of breast: Secondary | ICD-10-CM

## 2021-12-08 DIAGNOSIS — R928 Other abnormal and inconclusive findings on diagnostic imaging of breast: Secondary | ICD-10-CM

## 2022-01-04 ENCOUNTER — Ambulatory Visit
Admission: RE | Admit: 2022-01-04 | Discharge: 2022-01-04 | Disposition: A | Discharge status: Home / Self Care | Payer: Medicaid Other | Source: Ambulatory Visit | Attending: Primary Care | Admitting: Primary Care

## 2022-01-04 DIAGNOSIS — R928 Other abnormal and inconclusive findings on diagnostic imaging of breast: Secondary | ICD-10-CM

## 2022-01-04 DIAGNOSIS — N6489 Other specified disorders of breast: Secondary | ICD-10-CM | POA: Insufficient documentation

## 2022-09-13 LAB — LAB REPORT - SCANNED: EGFR: 53

## 2022-09-14 ENCOUNTER — Encounter: Payer: Self-pay | Admitting: Primary Care

## 2022-09-18 ENCOUNTER — Other Ambulatory Visit: Payer: Self-pay | Admitting: Primary Care

## 2022-09-18 DIAGNOSIS — I10 Essential (primary) hypertension: Secondary | ICD-10-CM

## 2022-09-25 ENCOUNTER — Ambulatory Visit
Admission: RE | Admit: 2022-09-25 | Discharge: 2022-09-25 | Disposition: A | Discharge status: Home / Self Care | Payer: Medicaid Other | Source: Ambulatory Visit | Attending: Primary Care | Admitting: Primary Care

## 2022-09-25 DIAGNOSIS — I10 Essential (primary) hypertension: Secondary | ICD-10-CM | POA: Diagnosis present

## 2022-10-16 ENCOUNTER — Ambulatory Visit: Payer: Medicaid Other | Attending: Cardiology

## 2022-10-16 ENCOUNTER — Ambulatory Visit: Payer: Medicaid Other | Attending: Cardiology | Admitting: Cardiology

## 2022-10-16 ENCOUNTER — Encounter: Payer: Self-pay | Admitting: Cardiology

## 2022-10-16 VITALS — BP 122/84 | HR 56 | Ht 61.0 in | Wt 126.0 lb

## 2022-10-16 DIAGNOSIS — I10 Essential (primary) hypertension: Secondary | ICD-10-CM

## 2022-10-16 DIAGNOSIS — R42 Dizziness and giddiness: Secondary | ICD-10-CM

## 2022-10-16 NOTE — Progress Notes (Unsigned)
Enrolled for [employer] to mail a ZIO XT long term holter monitor to the patients address on file.  

## 2022-10-16 NOTE — Progress Notes (Signed)
Cardiology Office Note:    Date:  10/16/2022   ID:  Rachel [name], DOB 04-18-1946, MRN [id number]  PCP:  Freddy Finner, NP  Cardiologist:  Berniece Salines, DO  Electrophysiologist:  None   Referring MD: Freddy Finner, NP   " I am   History of Present Illness:    Rachel [name] is a 77 y.o. female with a hx of GERD, hypertension, here today to be evaluated for low blood pressure.  She is originally from [place] and does not speak English however is here with her interpreter in the office.  Her son is also present.  The patient tells me that one of the biggest concern is the fact that she is experiencing lightheaded and dizziness when she wake up usually first thing in the morning.  She experiences basically 3 out of 4 times in the morning.  Her son mentions that they have been taking her blood pressure daily and the blood pressure can vary from in the high 90s to the 140 mmHg.  She is on amlodipine 2.5 mg daily.  No other complaints at this time.  Past Medical History:  Diagnosis Date   GERD (gastroesophageal reflux disease)    Glaucoma    Hypertension    Osteoporosis    Vitamin D deficiency     Past Surgical History:  Procedure Laterality Date   ABDOMINAL HYSTERECTOMY     CATARACT EXTRACTION W/ INTRAOCULAR LENS IMPLANT     PHOTOCOAGULATION WITH LASER Left 08/27/2018   Procedure: Baker TRANSCLERAL DIODE CYCLOPHOTOCOAGULATION;  Surgeon: Leandrew Koyanagi, MD;  Location: [place];  Service: Ophthalmology;  Laterality: Left;    Current Medications: Current Meds  Medication Sig   acetaZOLAMIDE (DIAMOX) 500 MG capsule Take 500 mg by mouth 2 (two) times daily.   aspirin 325 MG tablet Take 1 tablet (325 mg total) by mouth daily.   atorvastatin (LIPITOR) 40 MG tablet Take 1 tablet (40 mg total) by mouth daily at 6 PM.   brimonidine (ALPHAGAN) 0.2 % ophthalmic solution Place 1 drop into both eyes 2 (two) times daily.    Calcium Carbonate-Vitamin D (CALCIUM 600+D) 600-400  MG-UNIT tablet Take 1 tablet by mouth 2 (two) times daily.   dorzolamide-timolol (COSOPT) 22.3-6.8 MG/ML ophthalmic solution Place 1 drop into both eyes 2 (two) times daily.    latanoprost (XALATAN) 0.005 % ophthalmic solution Place 1 drop into both eyes at bedtime.   pantoprazole (PROTONIX) 40 MG tablet Take 40 mg by mouth daily.      Allergies:   Patient has no known allergies.   Social History   Socioeconomic History   Marital status: Widowed    Spouse name: Not on file   Number of children: Not on file   Years of education: Not on file   Highest education level: Not on file  Occupational History   Not on file  Tobacco Use   Smoking status: Never   Smokeless tobacco: Never  Vaping Use   Vaping Use: Never used  Substance and Sexual Activity   Alcohol use: Not Currently    Alcohol/week: 0.0 standard drinks of alcohol   Drug use: No   Sexual activity: Not on file  Other Topics Concern   Not on file  Social History Narrative   Not on file   Social Determinants of Health   Financial Resource Strain: Not on file  Food Insecurity: Not on file  Transportation Needs: Not on file  Physical Activity: Not on file  Stress: Not on file  Social Connections: Not on file     Family History: The patient's family history is negative for Breast cancer.  ROS:   Review of Systems  Constitution: Negative for decreased appetite, fever and weight gain.  HENT: Negative for congestion, ear discharge, hoarse voice and sore throat.   Eyes: Negative for discharge, redness, vision loss in right eye and visual halos.  Cardiovascular: Negative for chest pain, dyspnea on exertion, leg swelling, orthopnea and palpitations.  Respiratory: Negative for cough, hemoptysis, shortness of breath and snoring.   Endocrine: Negative for heat intolerance and polyphagia.  Hematologic/Lymphatic: Negative for bleeding problem. Does not bruise/bleed easily.  Skin: Negative for flushing, nail changes, rash and  suspicious lesions.  Musculoskeletal: Negative for arthritis, joint pain, muscle cramps, myalgias, neck pain and stiffness.  Gastrointestinal: Negative for abdominal pain, bowel incontinence, diarrhea and excessive appetite.  Genitourinary: Negative for decreased libido, genital sores and incomplete emptying.  Neurological: Negative for brief paralysis, focal weakness, headaches and loss of balance.  Psychiatric/Behavioral: Negative for altered mental status, depression and suicidal ideas.  Allergic/Immunologic: Negative for HIV exposure and persistent infections.    EKGs/Labs/Other Studies Reviewed:    The following studies were reviewed today:   EKG:  The ekg ordered today demonstrates sinus bradycardia with sinus arrhythmia heart rate 56 bpm.  Recent Labs: No results found for requested labs within last 365 days.  Recent Lipid Panel    Component Value Date/Time   CHOL 135 04/25/2019 0457   TRIG 45 04/25/2019 0457   HDL 73 04/25/2019 0457   CHOLHDL 1.8 04/25/2019 0457   VLDL 9 04/25/2019 0457   LDLCALC 53 04/25/2019 0457    Physical Exam:    VS:  BP 122/84 (BP Location: Left Arm, Patient Position: Sitting, Cuff Size: Normal)   Pulse (!) 56   Ht 5\' 1"  (1.549 m)   Wt 126 lb (57.2 kg)   SpO2 96%   BMI 23.81 kg/m     Wt Readings from Last 3 Encounters:  10/16/22 126 lb (57.2 kg)  05/14/19 110 lb 3.7 oz (50 kg)  04/24/19 110 lb (49.9 kg)     GEN: Well nourished, well developed in no acute distress HEENT: Normal NECK: No JVD; No carotid bruits LYMPHATICS: No lymphadenopathy CARDIAC: S1S2 noted,RRR, no murmurs, rubs, gallops RESPIRATORY:  Clear to auscultation without rales, wheezing or rhonchi  ABDOMEN: Soft, non-tender, non-distended, +bowel sounds, no guarding. EXTREMITIES: No edema, No cyanosis, no clubbing MUSCULOSKELETAL:  No deformity  SKIN: Warm and dry NEUROLOGIC:  Alert and oriented x 3, non-focal PSYCHIATRIC:  Normal affect, good insight  ASSESSMENT:     1. Dizziness   2. Essential hypertension    PLAN:     Her blood pressure was manually taken by me in the office which was normal.  Will continue the amlodipine 2.5 mg daily. I have asked the patient to get back on the counter to wear while she is awake. I am concerned about her dizziness and in the setting of the sinus bradycardia in the office I like to place a monitor on the patient to make sure that she is not getting significantly symptomatic.  I educated the patient on how to use this via our interpreter service she has no questions at this time.   The patient is in agreement with the above plan. The patient left the office in stable condition.  The patient will follow up in   Medication Adjustments/Labs and Tests Ordered: Current medicines are reviewed at length with the  patient today.  Concerns regarding medicines are outlined above.  Orders Placed This Encounter  Procedures   LONG TERM MONITOR (3-14 DAYS)   EKG 12-Lead   No orders of the defined types were placed in this encounter.   Patient Instructions  Medication Instructions:  Your physician recommends that you continue on your current medications as directed. Please refer to the Current Medication list given to you today.  *If you need a refill on your cardiac medications before your next appointment, please call your pharmacy*   Lab Work: None   Testing/Procedures: [place] Monitor Instructions  Your physician has requested you wear a ZIO patch monitor for 14 days.  This is a single patch monitor. [employer] supplies one patch monitor per enrollment. Additional stickers are not available. Please do not apply patch if you will be having a Nuclear Stress Test,  Echocardiogram, Cardiac CT, MRI, or Chest Xray during the period you would be wearing the  monitor. The patch cannot be worn during these tests. You cannot remove and re-apply the  ZIO XT patch monitor.  Your ZIO patch monitor will be mailed 3  day USPS to your address on file. It may take 3-5 days  to receive your monitor after you have been enrolled.  Once you have received your monitor, please review the enclosed instructions. Your monitor  has already been registered assigning a specific monitor serial # to you.  Billing and Patient Assistance Program Information  We have supplied [employer] with any of your insurance information on file for billing purposes. [employer] offers a sliding scale Patient Assistance Program for patients that do not have  insurance, or whose insurance does not completely cover the cost of the ZIO monitor.  You must apply for the Patient Assistance Program to qualify for this discounted rate.  To apply, please call [employer] at [phone], select option 4, select option 2, ask to apply for  Patient Assistance Program. Theodore Demark will ask your household income, and how many people  are in your household. They will quote your out-of-pocket cost based on that information.  [employer] will also be able to set up a 30-month, interest-free payment plan if needed.  Applying the monitor   Shave hair from upper left chest.  Hold abrader disc by orange tab. Rub abrader in 40 strokes over the upper left chest as  indicated in your monitor instructions.  Clean area with 4 enclosed alcohol pads. Let dry.  Apply patch as indicated in monitor instructions. Patch will be placed under collarbone on left  side of chest with arrow pointing upward.  Rub patch adhesive wings for 2 minutes. Remove white label marked "1". Remove the white  label marked "2". Rub patch adhesive wings for 2 additional minutes.  While looking in a mirror, press and release button in center of patch. A small green light will  flash 3-4 times. This will be your only indicator that the monitor has been turned on.  Do not shower for the first 24 hours. You may shower after the first 24 hours.  Press the button if you feel a symptom. You will hear a small  click. Record Date, Time and  Symptom in the Patient Logbook.  When you are ready to remove the patch, follow instructions on the last 2 pages of Patient  Logbook. Stick patch monitor onto the last page of Patient Logbook.  Place Patient Logbook in the blue and white box. Use locking tab on box and tape  box closed  securely. The blue and white box has prepaid postage on it. Please place it in the mailbox as  soon as possible. Your physician should have your test results approximately 7 days after the  monitor has been mailed back to [facility].  Call [place] at [phone] if you have questions regarding  your ZIO XT patch monitor. Call them immediately if you see an orange light blinking on your  monitor.  If your monitor falls off in less than 4 days, contact our Monitor department at [phone].  If your monitor becomes loose or falls off after 4 days call [employer] at [phone] for  suggestions on securing your monitor    Follow-Up: At [facility], you and your health needs are our priority.  As part of our continuing mission to provide you with exceptional heart care, we have created designated Provider Care Teams.  These Care Teams include your primary Cardiologist (physician) and Advanced Practice Providers (APPs -  Physician Assistants and Nurse Practitioners) who all work together to provide you with the care you need, when you need it.   Your next appointment:   4 month(s)  Provider:   Berniece Salines, DO    Other instructions: Please purchase an abdominal binder and wear it while awake.    Adopting a Healthy Lifestyle.  Know what a healthy weight is for you (roughly BMI <25) and aim to maintain this   Aim for 7+ servings of fruits and vegetables daily   65-80+ fluid ounces of water or unsweet tea for healthy kidneys   Limit to max 1 drink of alcohol per day; avoid smoking/tobacco   Limit animal fats in diet for cholesterol and  heart health - choose grass fed whenever available   Avoid highly processed foods, and foods high in saturated/trans fats   Aim for low stress - take time to unwind and care for your mental health   Aim for 150 min of moderate intensity exercise weekly for heart health, and weights twice weekly for bone health   Aim for 7-9 hours of sleep daily   When it comes to diets, agreement about the perfect plan isnt easy to find, even among the experts. Experts at the [employer] developed an idea known as the Healthy Eating Plate. Just imagine a plate divided into logical, healthy portions.   The emphasis is on diet quality:   Load up on vegetables and fruits - one-half of your plate: Aim for color and variety, and remember that potatoes dont count.   Go for whole grains - one-quarter of your plate: Whole wheat, barley, wheat berries, quinoa, oats, brown rice, and foods made with them. If you want pasta, go with whole wheat pasta.   Protein power - one-quarter of your plate: Fish, chicken, beans, and nuts are all healthy, versatile protein sources. Limit red meat.   The diet, however, does go beyond the plate, offering a few other suggestions.   Use healthy plant oils, such as olive, canola, soy, corn, sunflower and peanut. Check the labels, and avoid partially hydrogenated oil, which have unhealthy trans fats.   If youre thirsty, drink water. Coffee and tea are good in moderation, but skip sugary drinks and limit milk and dairy products to one or two daily servings.   The type of carbohydrate in the diet is more important than the amount. Some sources of carbohydrates, such as vegetables, fruits, whole grains, and beans-are healthier than others.  Finally, stay active  Signed, Berniece Salines, DO  10/16/2022 1:55 PM    [facility]

## 2022-10-16 NOTE — Patient Instructions (Signed)
Medication Instructions:  Your physician recommends that you continue on your current medications as directed. Please refer to the Current Medication list given to you today.  *If you need a refill on your cardiac medications before your next appointment, please call your pharmacy*   Lab Work: None   Testing/Procedures: [place] Monitor Instructions  Your physician has requested you wear a ZIO patch monitor for 14 days.  This is a single patch monitor. [employer] supplies one patch monitor per enrollment. Additional stickers are not available. Please do not apply patch if you will be having a Nuclear Stress Test,  Echocardiogram, Cardiac CT, MRI, or Chest Xray during the period you would be wearing the  monitor. The patch cannot be worn during these tests. You cannot remove and re-apply the  ZIO XT patch monitor.  Your ZIO patch monitor will be mailed 3 day USPS to your address on file. It may take 3-5 days  to receive your monitor after you have been enrolled.  Once you have received your monitor, please review the enclosed instructions. Your monitor  has already been registered assigning a specific monitor serial # to you.  Billing and Patient Assistance Program Information  We have supplied [employer] with any of your insurance information on file for billing purposes. [employer] offers a sliding scale Patient Assistance Program for patients that do not have  insurance, or whose insurance does not completely cover the cost of the ZIO monitor.  You must apply for the Patient Assistance Program to qualify for this discounted rate.  To apply, please call [employer] at [phone], select option 4, select option 2, ask to apply for  Patient Assistance Program. Theodore Demark will ask your household income, and how many people  are in your household. They will quote your out-of-pocket cost based on that information.  [employer] will also be able to set up a 59-month interest-free payment plan if  needed.  Applying the monitor   Shave hair from upper left chest.  Hold abrader disc by orange tab. Rub abrader in 40 strokes over the upper left chest as  indicated in your monitor instructions.  Clean area with 4 enclosed alcohol pads. Let dry.  Apply patch as indicated in monitor instructions. Patch will be placed under collarbone on left  side of chest with arrow pointing upward.  Rub patch adhesive wings for 2 minutes. Remove white label marked "1". Remove the white  label marked "2". Rub patch adhesive wings for 2 additional minutes.  While looking in a mirror, press and release button in center of patch. A small green light will  flash 3-4 times. This will be your only indicator that the monitor has been turned on.  Do not shower for the first 24 hours. You may shower after the first 24 hours.  Press the button if you feel a symptom. You will hear a small click. Record Date, Time and  Symptom in the Patient Logbook.  When you are ready to remove the patch, follow instructions on the last 2 pages of Patient  Logbook. Stick patch monitor onto the last page of Patient Logbook.  Place Patient Logbook in the blue and white box. Use locking tab on box and tape box closed  securely. The blue and white box has prepaid postage on it. Please place it in the mailbox as  soon as possible. Your physician should have your test results approximately 7 days after the  monitor has been mailed back to [facility]  Call [facility] at [phone] if you have questions regarding  your ZIO XT patch monitor. Call them immediately if you see an orange light blinking on your  monitor.  If your monitor falls off in less than 4 days, contact our Monitor department at [phone].  If your monitor becomes loose or falls off after 4 days call [employer] at [phone] for  suggestions on securing your monitor    Follow-Up: At [facility], you and your health needs are  our priority.  As part of our continuing mission to provide you with exceptional heart care, we have created designated Provider Care Teams.  These Care Teams include your primary Cardiologist (physician) and Advanced Practice Providers (APPs -  Physician Assistants and Nurse Practitioners) who all work together to provide you with the care you need, when you need it.   Your next appointment:   4 month(s)  Provider:   Berniece Salines, DO    Other instructions: Please purchase an abdominal binder and wear it while awake.

## 2022-10-22 DIAGNOSIS — R42 Dizziness and giddiness: Secondary | ICD-10-CM

## 2022-12-19 ENCOUNTER — Ambulatory Visit: Payer: Medicaid Other | Attending: Cardiology | Admitting: Cardiology

## 2022-12-19 ENCOUNTER — Encounter: Payer: Self-pay | Admitting: Cardiology

## 2022-12-19 VITALS — BP 122/78 | Ht 62.0 in | Wt 125.0 lb

## 2022-12-19 DIAGNOSIS — I491 Atrial premature depolarization: Secondary | ICD-10-CM

## 2022-12-19 DIAGNOSIS — I10 Essential (primary) hypertension: Secondary | ICD-10-CM | POA: Diagnosis not present

## 2022-12-19 DIAGNOSIS — I471 Supraventricular tachycardia, unspecified: Secondary | ICD-10-CM

## 2022-12-19 MED ORDER — METOPROLOL SUCCINATE ER 25 MG PO TB24: 12.5000 mg | ORAL_TABLET | Freq: Every day | ORAL | 3 refills | Status: DC

## 2022-12-19 NOTE — Patient Instructions (Signed)
Medication Instructions:  Your physician has recommended you make the following change in your medication:  STOP: Amlodipine START: Metoprolol succinate (Toprol-XL) 12.5 mg once daily *If you need a refill on your cardiac medications before your next appointment, please call your pharmacy*   Lab Work: None   Testing/Procedures: None   Follow-Up: At [facility], you and your health needs are our priority.  As part of our continuing mission to provide you with exceptional heart care, we have created designated Provider Care Teams.  These Care Teams include your primary Cardiologist (physician) and Advanced Practice Providers (APPs -  Physician Assistants and Nurse Practitioners) who all work together to provide you with the care you need, when you need it.   Your next appointment:   6 month(s)  Provider:   Antonieta Iba, MD

## 2022-12-19 NOTE — Progress Notes (Signed)
Cardiology Office Note:    Date:  12/19/2022   ID:  Rachel [name], DOB 06-30-1946, MRN [id number]  PCP:  Sandrea Hughs, NP  Cardiologist:  Thomasene Ripple, DO  Electrophysiologist:  None   Referring MD: Sandrea Hughs, NP   " I am doing well"   History of Present Illness:    Rachel [name] is a 77 y.o. female with a hx of GERD, hypertension, here today to be evaluated for low blood pressure.  She is originally from [place] and does not speak English however is here with her interpreter in the office.  At her visit on October 16, 2022 her son is also present.  The patient tells me that one of the biggest concern is the fact that she is experiencing lightheaded and dizziness when she wake up usually first thing in the morning.  She experiences basically 3 out of 4 times in the morning.  Her son mentions that they have been taking her blood pressure daily and the blood pressure can vary from in the high 90s to the 140 mmHg.  She is on amlodipine 2.5 mg daily.    At the conclusion of the visit I kept her on her amlodipine.  Placed a monitor the patient.  She is here to discuss her monitor results.  She is experiencing intermittent dizziness..  Past Medical History:  Diagnosis Date   GERD (gastroesophageal reflux disease)    Glaucoma    Hypertension    Osteoporosis    Vitamin D deficiency     Past Surgical History:  Procedure Laterality Date   ABDOMINAL HYSTERECTOMY     CATARACT EXTRACTION W/ INTRAOCULAR LENS IMPLANT     PHOTOCOAGULATION WITH LASER Left 08/27/2018   Procedure: TDC TRANSCLERAL DIODE CYCLOPHOTOCOAGULATION;  Surgeon: Lockie Mola, MD;  Location: [facility];  Service: Ophthalmology;  Laterality: Left;    Current Medications: Current Meds  Medication Sig   ASPIRIN LOW DOSE 81 MG tablet Take 81 mg by mouth daily.   Calcium Carb-Cholecalciferol 600-10 MG-MCG TABS Take 1 tablet by mouth 2 (two) times daily.   cyclobenzaprine (FLEXERIL) 5 MG tablet Take 5 mg  by mouth at bedtime as needed.   metoprolol succinate (TOPROL XL) 25 MG 24 hr tablet Take 0.5 tablets (12.5 mg total) by mouth daily.   naproxen (NAPROSYN) 500 MG tablet Take 500 mg by mouth as needed.   [DISCONTINUED] amLODipine (NORVASC) 5 MG tablet Take 5 mg by mouth daily.     Allergies:   Patient has no known allergies.   Social History   Socioeconomic History   Marital status: Widowed    Spouse name: Not on file   Number of children: Not on file   Years of education: Not on file   Highest education level: Not on file  Occupational History   Not on file  Tobacco Use   Smoking status: Never   Smokeless tobacco: Never  Vaping Use   Vaping Use: Never used  Substance and Sexual Activity   Alcohol use: Not Currently    Alcohol/week: 0.0 standard drinks of alcohol   Drug use: No   Sexual activity: Not on file  Other Topics Concern   Not on file  Social History Narrative   Not on file   Social Determinants of Health   Financial Resource Strain: Not on file  Food Insecurity: Not on file  Transportation Needs: Not on file  Physical Activity: Not on file  Stress: Not on file  Social Connections: Not on  file     Family History: The patient's family history is negative for Breast cancer.  ROS:   Review of Systems  Constitution: Negative for decreased appetite, fever and weight gain.  HENT: Negative for congestion, ear discharge, hoarse voice and sore throat.   Eyes: Negative for discharge, redness, vision loss in right eye and visual halos.  Cardiovascular: Negative for chest pain, dyspnea on exertion, leg swelling, orthopnea and palpitations.  Respiratory: Negative for cough, hemoptysis, shortness of breath and snoring.   Endocrine: Negative for heat intolerance and polyphagia.  Hematologic/Lymphatic: Negative for bleeding problem. Does not bruise/bleed easily.  Skin: Negative for flushing, nail changes, rash and suspicious lesions.  Musculoskeletal: Negative for  arthritis, joint pain, muscle cramps, myalgias, neck pain and stiffness.  Gastrointestinal: Negative for abdominal pain, bowel incontinence, diarrhea and excessive appetite.  Genitourinary: Negative for decreased libido, genital sores and incomplete emptying.  Neurological: Negative for brief paralysis, focal weakness, headaches and loss of balance.  Psychiatric/Behavioral: Negative for altered mental status, depression and suicidal ideas.  Allergic/Immunologic: Negative for HIV exposure and persistent infections.    EKGs/Labs/Other Studies Reviewed:    The following studies were reviewed today:   EKG:  The ekg ordered today demonstrates sinus bradycardia with sinus arrhythmia heart rate 56 bpm.  Zio monitor 11/15/2022 Patch Wear Time:  13 days and 23 hours (2024-04-08T18:54:21-0400 to 2024-04-22T18:39:41-0400)   Patient had a min HR of 48 bpm, max HR of 193 bpm, and avg HR of 75 bpm. Predominant underlying rhythm was Sinus Rhythm. Slight P wave morphology changes were noted.    82 Supraventricular Tachycardia runs occurred, the run with the fastest interval lasting  5 beats with a max rate of 193 bpm, the longest lasting 14.4 secs with an avg rate of 110 bpm. Some episodes of Supraventricular Tachycardia may be possible Atrial Tachycardia with variable block. Isolated SVEs were occasional (1.6%, 22509), SVE Couplets were rare (<1.0%, 165), and SVE Triplets were rare (<1.0%, 67). Isolated VEs were rare (<1.0%), VE Couplets were rare (<1.0%), and no VE Triplets were present. Ventricular Trigeminy was present.   Symptoms associated with sinus rhythm.   Conclusion: This study showed evidence of paroxysmal supraventricular tachycardia.    Recent Labs: No results found for requested labs within last 365 days.  Recent Lipid Panel    Component Value Date/Time   CHOL 135 04/25/2019 0457   TRIG 45 04/25/2019 0457   HDL 73 04/25/2019 0457   CHOLHDL 1.8 04/25/2019 0457   VLDL 9 04/25/2019 0457    LDLCALC 53 04/25/2019 0457    Physical Exam:    VS:  BP 122/78 (BP Location: Left Arm, Patient Position: Sitting, Cuff Size: Normal)   Ht 5\' 2"  (1.575 m)   Wt 125 lb (56.7 kg)   SpO2 97%   BMI 22.86 kg/m     Wt Readings from Last 3 Encounters:  12/19/22 125 lb (56.7 kg)  10/16/22 126 lb (57.2 kg)  05/14/19 110 lb 3.7 oz (50 kg)     GEN: Well nourished, well developed in no acute distress HEENT: Normal NECK: No JVD; No carotid bruits LYMPHATICS: No lymphadenopathy CARDIAC: S1S2 noted,RRR, no murmurs, rubs, gallops RESPIRATORY:  Clear to auscultation without rales, wheezing or rhonchi  ABDOMEN: Soft, non-tender, non-distended, +bowel sounds, no guarding. EXTREMITIES: No edema, No cyanosis, no clubbing MUSCULOSKELETAL:  No deformity  SKIN: Warm and dry NEUROLOGIC:  Alert and oriented x 3, non-focal PSYCHIATRIC:  Normal affect, good insight  ASSESSMENT:    1. PSVT (  paroxysmal supraventricular tachycardia)   2. PAC (premature atrial contraction)   3. Essential hypertension     PLAN:    We discussed her monitor result which showed evidence of paroxysmal SVT as well as occasional PACs. She is on amlodipine 2.5 mg daily and will transition her off of amlodipine to Toprol XL 12.5 mg daily. This will help as she does tell me her blood pressure also dropped in the 80s at times at home.  She prefers to follow-up in the [place] office she has seen Dr. Mariah Milling in the past we will set her up with Dr. Nelda Marseille.  I educated the patient on how to use this via our interpreter service she has no questions at this time.   The patient is in agreement with the above plan. The patient left the office in stable condition.  The patient will follow up in 6 months or sooner if needed.   Medication Adjustments/Labs and Tests Ordered: Current medicines are reviewed at length with the patient today.  Concerns regarding medicines are outlined above.  No orders of the defined types were  placed in this encounter.  Meds ordered this encounter  Medications   metoprolol succinate (TOPROL XL) 25 MG 24 hr tablet    Sig: Take 0.5 tablets (12.5 mg total) by mouth daily.    Dispense:  45 tablet    Refill:  3    Patient Instructions  Medication Instructions:  Your physician has recommended you make the following change in your medication:  STOP: Amlodipine START: Metoprolol succinate (Toprol-XL) 12.5 mg once daily *If you need a refill on your cardiac medications before your next appointment, please call your pharmacy*   Lab Work: None   Testing/Procedures: None   Follow-Up: At [facility], you and your health needs are our priority.  As part of our continuing mission to provide you with exceptional heart care, we have created designated Provider Care Teams.  These Care Teams include your primary Cardiologist (physician) and Advanced Practice Providers (APPs -  Physician Assistants and Nurse Practitioners) who all work together to provide you with the care you need, when you need it.   Your next appointment:   6 month(s)  Provider:   Antonieta Iba, MD    Adopting a Healthy Lifestyle.  Know what a healthy weight is for you (roughly BMI <25) and aim to maintain this   Aim for 7+ servings of fruits and vegetables daily   65-80+ fluid ounces of water or unsweet tea for healthy kidneys   Limit to max 1 drink of alcohol per day; avoid smoking/tobacco   Limit animal fats in diet for cholesterol and heart health - choose grass fed whenever available   Avoid highly processed foods, and foods high in saturated/trans fats   Aim for low stress - take time to unwind and care for your mental health   Aim for 150 min of moderate intensity exercise weekly for heart health, and weights twice weekly for bone health   Aim for 7-9 hours of sleep daily   When it comes to diets, agreement about the perfect plan isnt easy to find, even among the  experts. Experts at the [facility] [employer] developed an idea known as the Healthy Eating Plate. Just imagine a plate divided into logical, healthy portions.   The emphasis is on diet quality:   Load up on vegetables and fruits - one-half of your plate: Aim for color and variety, and remember that  potatoes dont count.   Go for whole grains - one-quarter of your plate: Whole wheat, barley, wheat berries, quinoa, oats, brown rice, and foods made with them. If you want pasta, go with whole wheat pasta.   Protein power - one-quarter of your plate: Fish, chicken, beans, and nuts are all healthy, versatile protein sources. Limit red meat.   The diet, however, does go beyond the plate, offering a few other suggestions.   Use healthy plant oils, such as olive, canola, soy, corn, sunflower and peanut. Check the labels, and avoid partially hydrogenated oil, which have unhealthy trans fats.   If youre thirsty, drink water. Coffee and tea are good in moderation, but skip sugary drinks and limit milk and dairy products to one or two daily servings.   The type of carbohydrate in the diet is more important than the amount. Some sources of carbohydrates, such as vegetables, fruits, whole grains, and beans-are healthier than others.   Finally, stay active  Signed, Thomasene Ripple, DO  12/19/2022 10:36 PM    [facility]

## 2023-02-28 ENCOUNTER — Encounter: Payer: Self-pay | Admitting: Cardiology

## 2023-02-28 ENCOUNTER — Ambulatory Visit: Payer: Medicaid Other | Attending: Cardiology | Admitting: Cardiology

## 2023-06-24 NOTE — Progress Notes (Unsigned)
Cardiology Office Note  Date:  06/24/2023   ID:  Verdean, Brando [birth year], MRN [id number]  PCP:  Sandrea Hughs, NP   No chief complaint on file.   HPI:  Ms. Dimanche is a 77 year old woman from [facility],  Previously seen by myself March 2016 History of hypertension Hyperlipidemia Who presents by referral from Sandrea Hughs for low blood pressure  Seen in [facility] December 19, 2022 by one of our providers From [place], does not speak English On that visit amlodipine was held, metoprolol succinate 12.5 mg daily was started Monitor had shown paroxysmal SVT    Son reports she has no prior cardiac history. Blood pressure has been relatively well controlled. She was started on low-dose HCTZ 12.5 mg daily in addition to atenolol 25 mill grams daily in January 2016 but reports having side effects to the HCTZ. She has been continued on the atenolol with good blood pressure. Overall she feels well. 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  Son reports she is active, does all of her ADLs, goes shopping, does gardening, of her housework. Denies any shortness of breath, chest pain, leg edema, orthopnea or PND  Recent blood work shows total cholesterol 212, LDL 115, HDL 77, normal renal function, potassium 4.3, low vitamin D of 17   EKG on today's visit shows normal sinus rhythm with no significant ST or T-wave changes  Notes indicate allergy to ace inhibitors  PMH:   has a past medical history of GERD (gastroesophageal reflux disease), Glaucoma, Hypertension, Osteoporosis, and Vitamin D deficiency.  PSH:    Past Surgical History:  Procedure Laterality Date   ABDOMINAL HYSTERECTOMY     CATARACT EXTRACTION W/ INTRAOCULAR LENS IMPLANT     PHOTOCOAGULATION WITH LASER Left 08/27/2018   Procedure: TDC TRANSCLERAL DIODE CYCLOPHOTOCOAGULATION;  Surgeon: Lockie Mola, MD;  Location: [facility];  Service:  Ophthalmology;  Laterality: Left;    Current Outpatient Medications  Medication Sig Dispense Refill   acetaZOLAMIDE (DIAMOX) 500 MG capsule Take 500 mg by mouth 2 (two) times daily.     ASPIRIN LOW DOSE 81 MG tablet Take 81 mg by mouth daily.     atorvastatin (LIPITOR) 40 MG tablet Take 1 tablet (40 mg total) by mouth daily at 6 PM. 30 tablet 0   brimonidine (ALPHAGAN) 0.2 % ophthalmic solution Place 1 drop into both eyes 2 (two) times daily.   0   Calcium Carb-Cholecalciferol 600-10 MG-MCG TABS Take 1 tablet by mouth 2 (two) times daily.     Calcium Carbonate-Vitamin D (CALCIUM 600+D) 600-400 MG-UNIT tablet Take 1 tablet by mouth 2 (two) times daily.     cyclobenzaprine (FLEXERIL) 5 MG tablet Take 5 mg by mouth at bedtime as needed.     dorzolamide-timolol (COSOPT) 22.3-6.8 MG/ML ophthalmic solution Place 1 drop into both eyes 2 (two) times daily.   0   latanoprost (XALATAN) 0.005 % ophthalmic solution Place 1 drop into both eyes at bedtime.     metoprolol succinate (TOPROL XL) 25 MG 24 hr tablet Take 0.5 tablets (12.5 mg total) by mouth daily. 45 tablet 3   naproxen (NAPROSYN) 500 MG tablet Take 500 mg by mouth as needed.     pantoprazole (PROTONIX) 40 MG tablet Take 40 mg by mouth daily.   0   No current facility-administered medications for this visit.     Allergies:   Patient has no known allergies.  Social History:  The patient  reports that she has never smoked. She has never used smokeless tobacco. She reports that she does not currently use alcohol. She reports that she does not use drugs.   Family History:   family history is not on file.    Review of Systems: ROS   PHYSICAL EXAM: VS:  There were no vitals taken for this visit. , BMI There is no height or weight on file to calculate BMI. GEN: Well nourished, well developed, in no acute distress HEENT: normal Neck: no JVD, carotid bruits, or masses Cardiac: RRR; no murmurs, rubs, or gallops,no edema  Respiratory:   clear to auscultation bilaterally, normal work of breathing GI: soft, nontender, nondistended, + BS MS: no deformity or atrophy Skin: warm and dry, no rash Neuro:  Strength and sensation are intact Psych: euthymic mood, full affect    Recent Labs: No results found for requested labs within last 365 days.    Lipid Panel Lab Results  Component Value Date   CHOL 135 04/25/2019   HDL 73 04/25/2019   LDLCALC 53 04/25/2019   TRIG 45 04/25/2019      Wt Readings from Last 3 Encounters:  12/19/22 125 lb (56.7 kg)  10/16/22 126 lb (57.2 kg)  05/14/19 110 lb 3.7 oz (50 kg)       ASSESSMENT AND PLAN:  Problem List Items Addressed This Visit   None       Signed, Dossie Arbour, M.D., Ph.D. [facility] [place], [place] [phone]

## 2023-06-25 ENCOUNTER — Encounter: Payer: Self-pay | Admitting: Cardiovascular Disease

## 2023-06-25 ENCOUNTER — Ambulatory Visit: Payer: Medicaid Other | Attending: Cardiovascular Disease | Admitting: Cardiovascular Disease

## 2023-06-25 VITALS — BP 170/90 | HR 60 | Ht 62.0 in | Wt 126.5 lb

## 2023-06-25 DIAGNOSIS — R42 Dizziness and giddiness: Secondary | ICD-10-CM | POA: Diagnosis not present

## 2023-06-25 DIAGNOSIS — E782 Mixed hyperlipidemia: Secondary | ICD-10-CM | POA: Diagnosis not present

## 2023-06-25 DIAGNOSIS — I471 Supraventricular tachycardia, unspecified: Secondary | ICD-10-CM

## 2023-06-25 DIAGNOSIS — I959 Hypotension, unspecified: Secondary | ICD-10-CM

## 2023-06-25 MED ORDER — AMLODIPINE BESYLATE 2.5 MG PO TABS: 2.5000 mg | ORAL_TABLET | Freq: Every day | ORAL | 3 refills | Status: AC | PRN

## 2023-06-25 NOTE — Patient Instructions (Signed)
Medication Instructions:  Amlodipine 2.5 daily as needed for pressure >160  If you need a refill on your cardiac medications before your next appointment, please call your pharmacy.   Lab work: No new labs needed  Testing/Procedures: No new testing needed  Follow-Up: At [facility], you and your health needs are our priority.  As part of our continuing mission to provide you with exceptional heart care, we have created designated Provider Care Teams.  These Care Teams include your primary Cardiologist (physician) and Advanced Practice Providers (APPs -  Physician Assistants and Nurse Practitioners) who all work together to provide you with the care you need, when you need it.  You will need a follow up appointment in 12 months  Providers on your designated Care Team:   Nicolasa Ducking, NP Eula Listen, PA-C Cadence Fransico Michael, [place]  COVID-19 Vaccine Information can be found at: [email or web address] For questions related to vaccine distribution or appointments, please email [email or web address] or call [phone].

## 2023-09-16 ENCOUNTER — Other Ambulatory Visit: Payer: Self-pay | Admitting: Cardiology

## 2024-02-23 IMAGING — MG MM DIGITAL DIAGNOSTIC UNILAT*L* W/ TOMO W/ CAD
4 series · 4 of 12 positions shown · non-contrast
Comparison: Previous exam(s).

CLINICAL DATA: Callback for LEFT breast asymmetry seen on MLO view
only

EXAM:
DIGITAL DIAGNOSTIC UNILATERAL LEFT MAMMOGRAM WITH TOMOSYNTHESIS AND
CAD
TECHNIQUE: Left digital diagnostic mammography and breast tomosynthesis was
performed. The images were evaluated with computer-aided detection.

[L MLO synth-2D]
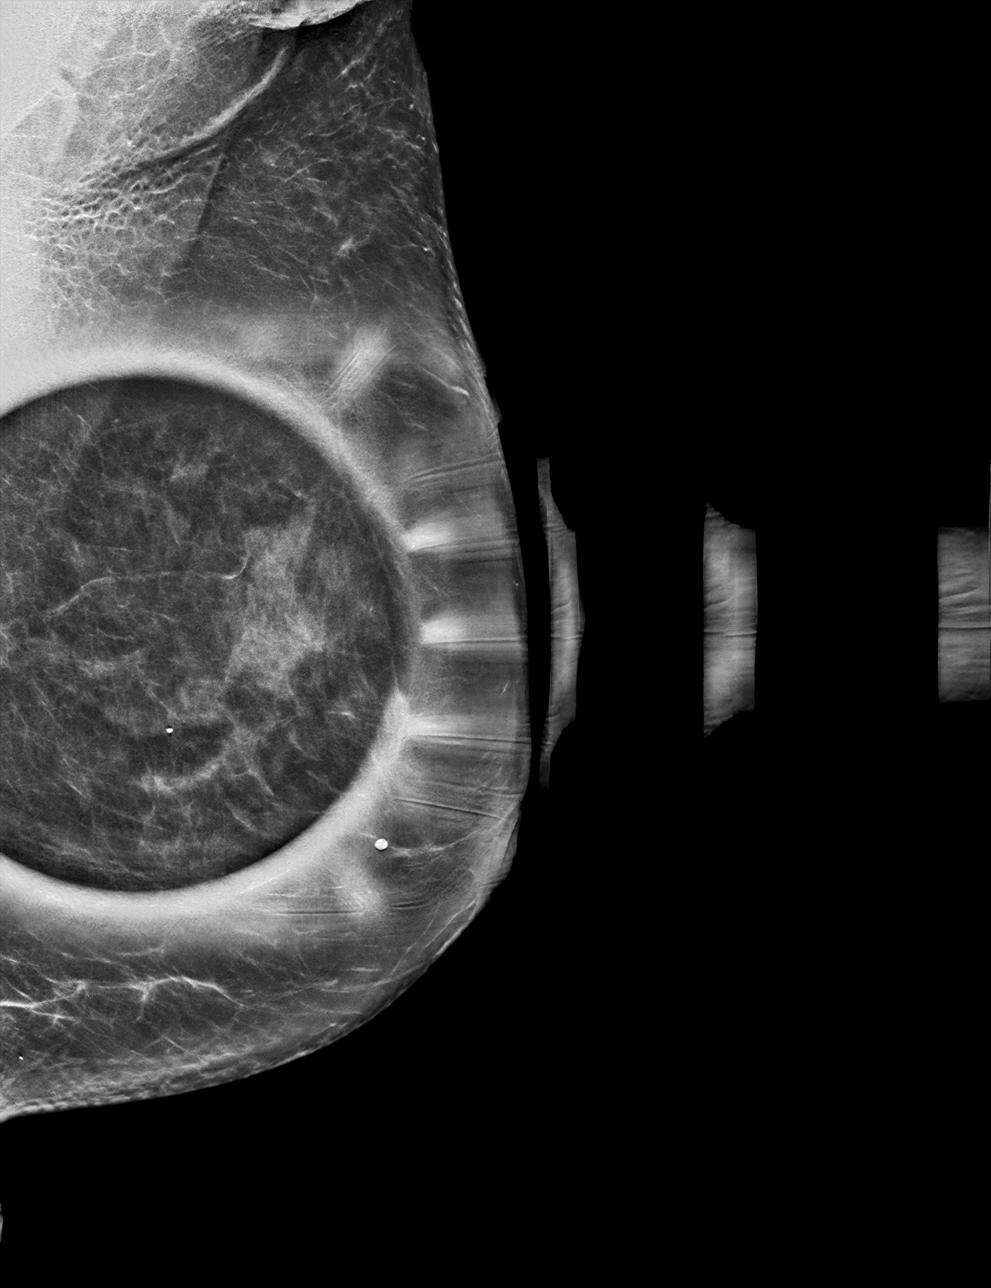

[L ML synth-2D]
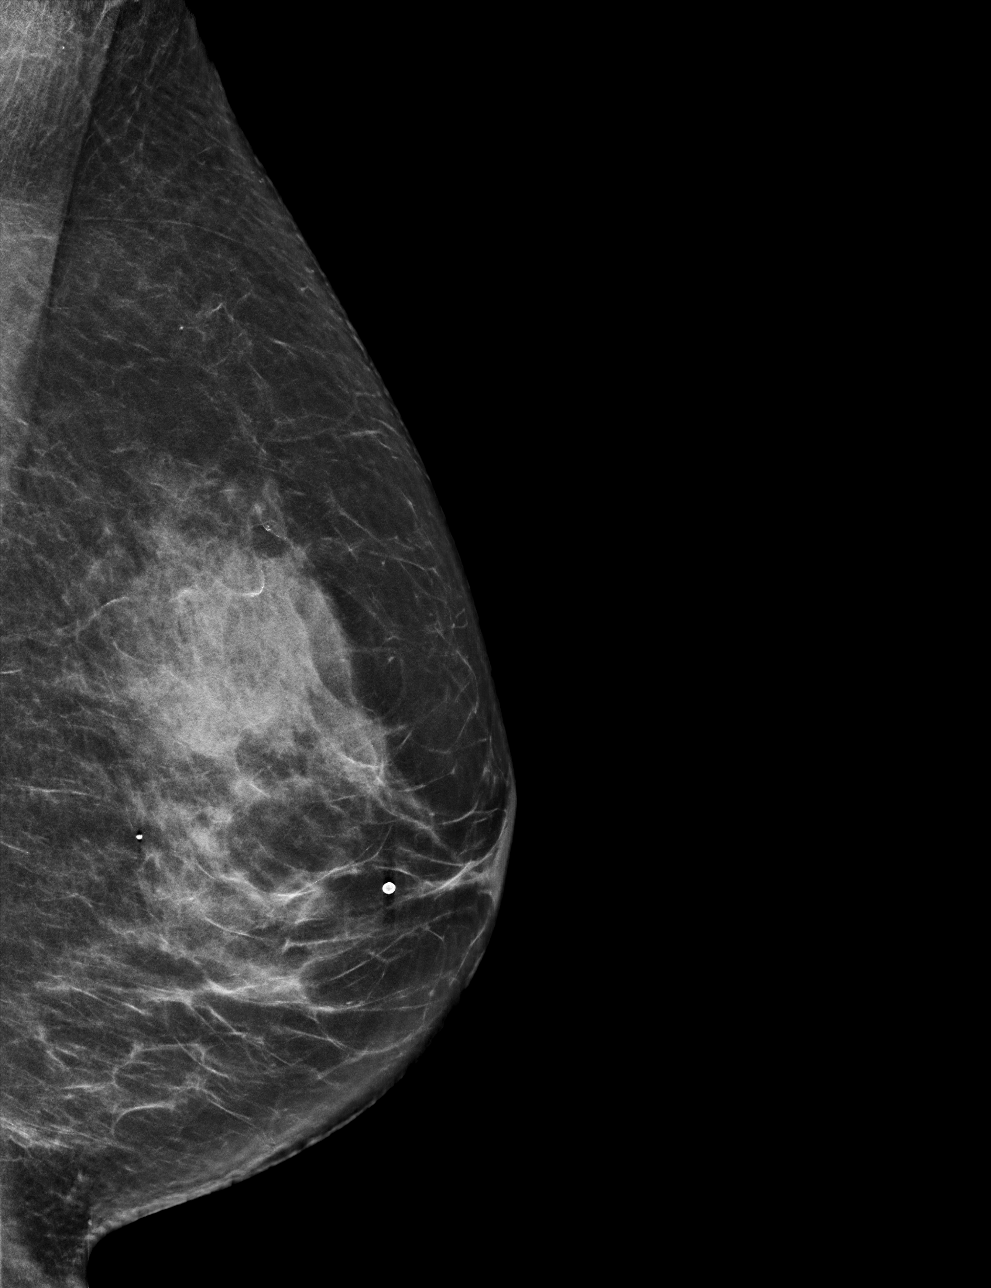

[L MLO tomo · tomo slice 35/68.0]
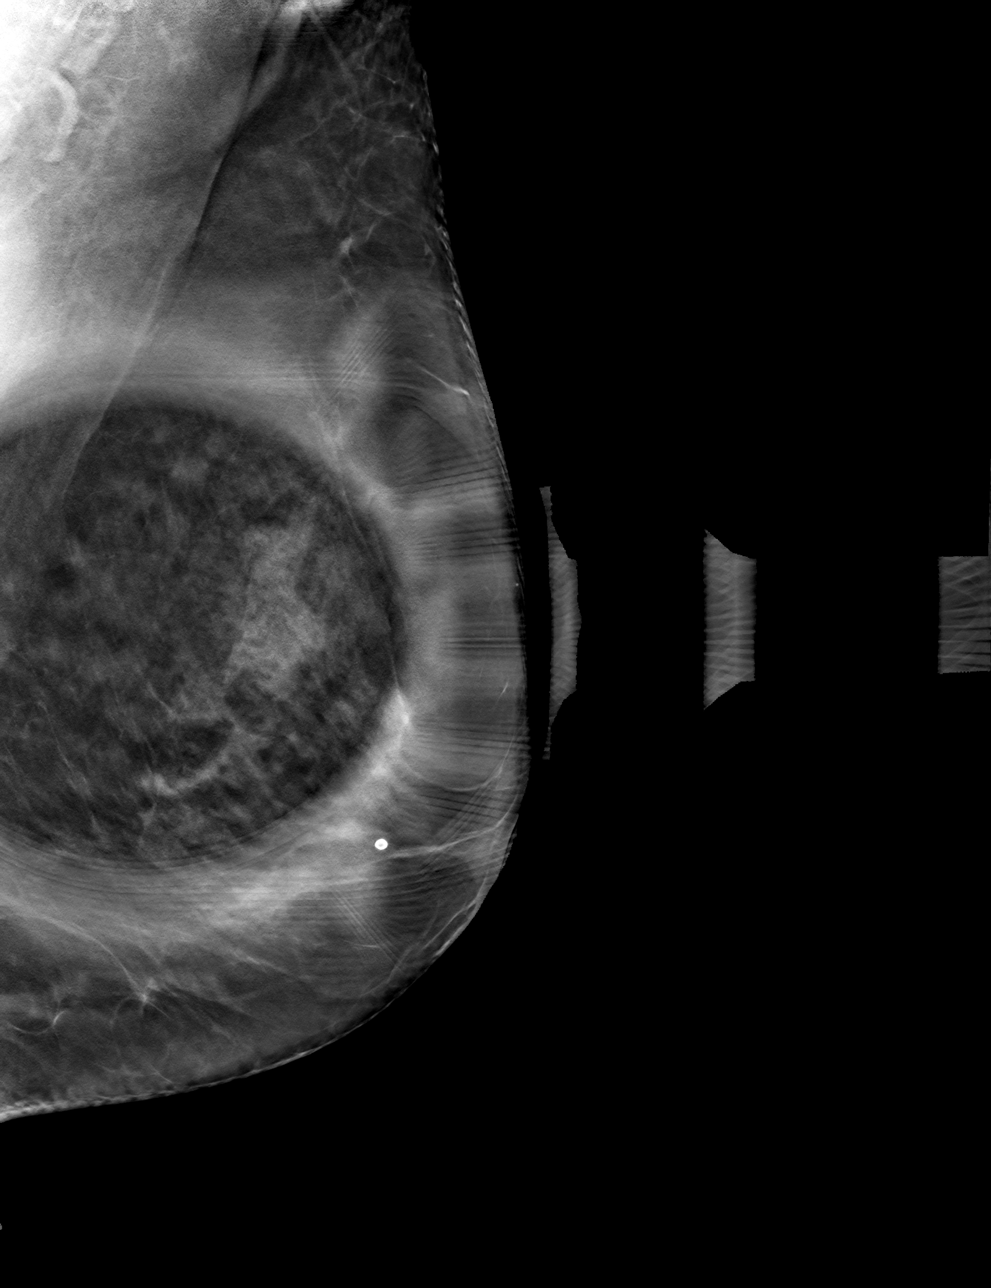

[L ML tomo · tomo slice 34/67.0]
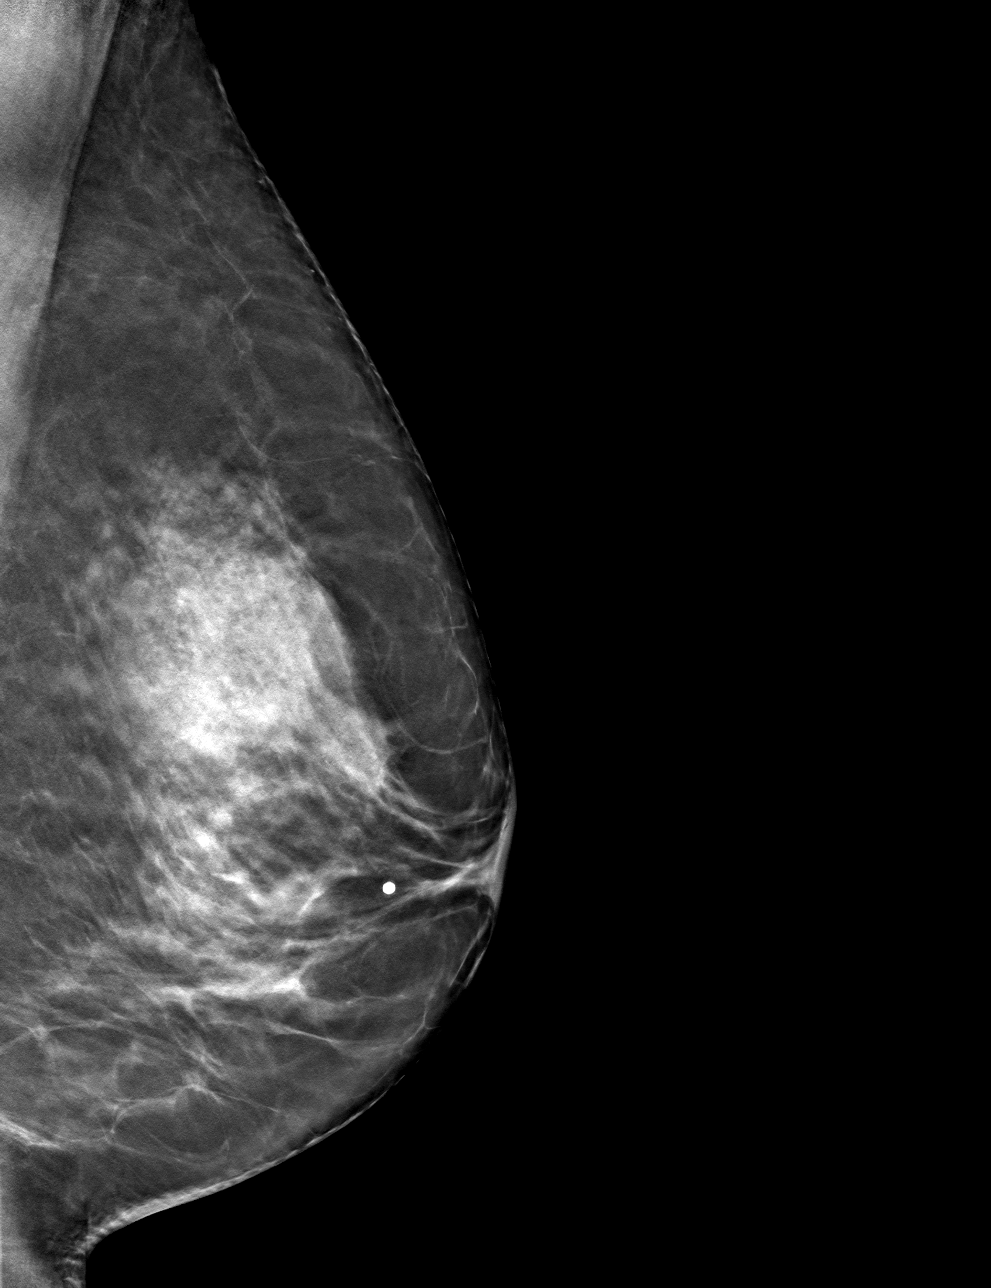

[4 of 12 positions shown; findings below may reference images not displayed]

ACR Breast Density Category c: The breast tissue is heterogeneously
dense, which may obscure small masses.
FINDINGS: The previously described finding does not persist with additional
views, consistent with superimposed fibroglandular tissue.
Fibroglandular tissue assumes a configuration stable in comparison
to remote prior mammograms. No suspicious mass, microcalcification,
or other finding is identified.
IMPRESSION: No mammographic evidence of malignancy.

RECOMMENDATION:
Screening mammogram in one year.(Code:CF-F-OIK)

I have discussed the findings and recommendations with the patient
and patient's son. If applicable, a reminder letter will be sent to
the patient regarding the next appointment.

BI-RADS CATEGORY  1: Negative.
